# Patient Record
Sex: Male | Born: 1975 | Race: White | Marital: Married | Smoking: Never smoker
Health system: Southern US, Community
[De-identification: ages and names within clinical notes are randomized; demographics above are authoritative.]

## PROBLEM LIST (undated history)

## (undated) DIAGNOSIS — K219 Gastro-esophageal reflux disease without esophagitis: Secondary | ICD-10-CM

## (undated) DIAGNOSIS — R519 Headache, unspecified: Secondary | ICD-10-CM

## (undated) DIAGNOSIS — M1990 Unspecified osteoarthritis, unspecified site: Secondary | ICD-10-CM

## (undated) DIAGNOSIS — J45909 Unspecified asthma, uncomplicated: Secondary | ICD-10-CM

## (undated) HISTORY — DX: Gastro-esophageal reflux disease without esophagitis: K21.9

## (undated) HISTORY — PX: HERNIA REPAIR: SHX51

## (undated) HISTORY — PX: WISDOM TOOTH EXTRACTION: SHX21

## (undated) HISTORY — DX: Unspecified asthma, uncomplicated: J45.909

## (undated) HISTORY — PX: BACK SURGERY: SHX140

## (undated) HISTORY — DX: Headache, unspecified: R51.9

## (undated) HISTORY — DX: Unspecified osteoarthritis, unspecified site: M19.90

---

## 2015-07-07 ENCOUNTER — Other Ambulatory Visit: Payer: Self-pay | Admitting: Occupational Medicine

## 2015-07-07 ENCOUNTER — Ambulatory Visit
Admission: RE | Admit: 2015-07-07 | Discharge: 2015-07-07 | Disposition: A | Discharge status: Home / Self Care | Payer: No Typology Code available for payment source | Source: Ambulatory Visit | Attending: Occupational Medicine | Admitting: Occupational Medicine

## 2015-07-07 DIAGNOSIS — Z139 Encounter for screening, unspecified: Secondary | ICD-10-CM

## 2015-07-07 IMAGING — CR DG CHEST 1V
1 series · 1 of 1 positions shown · non-contrast
Comparison: None.

CLINICAL DATA: Pre-employment physical examination.

EXAM:
CHEST 1 VIEW

[w chest pa]
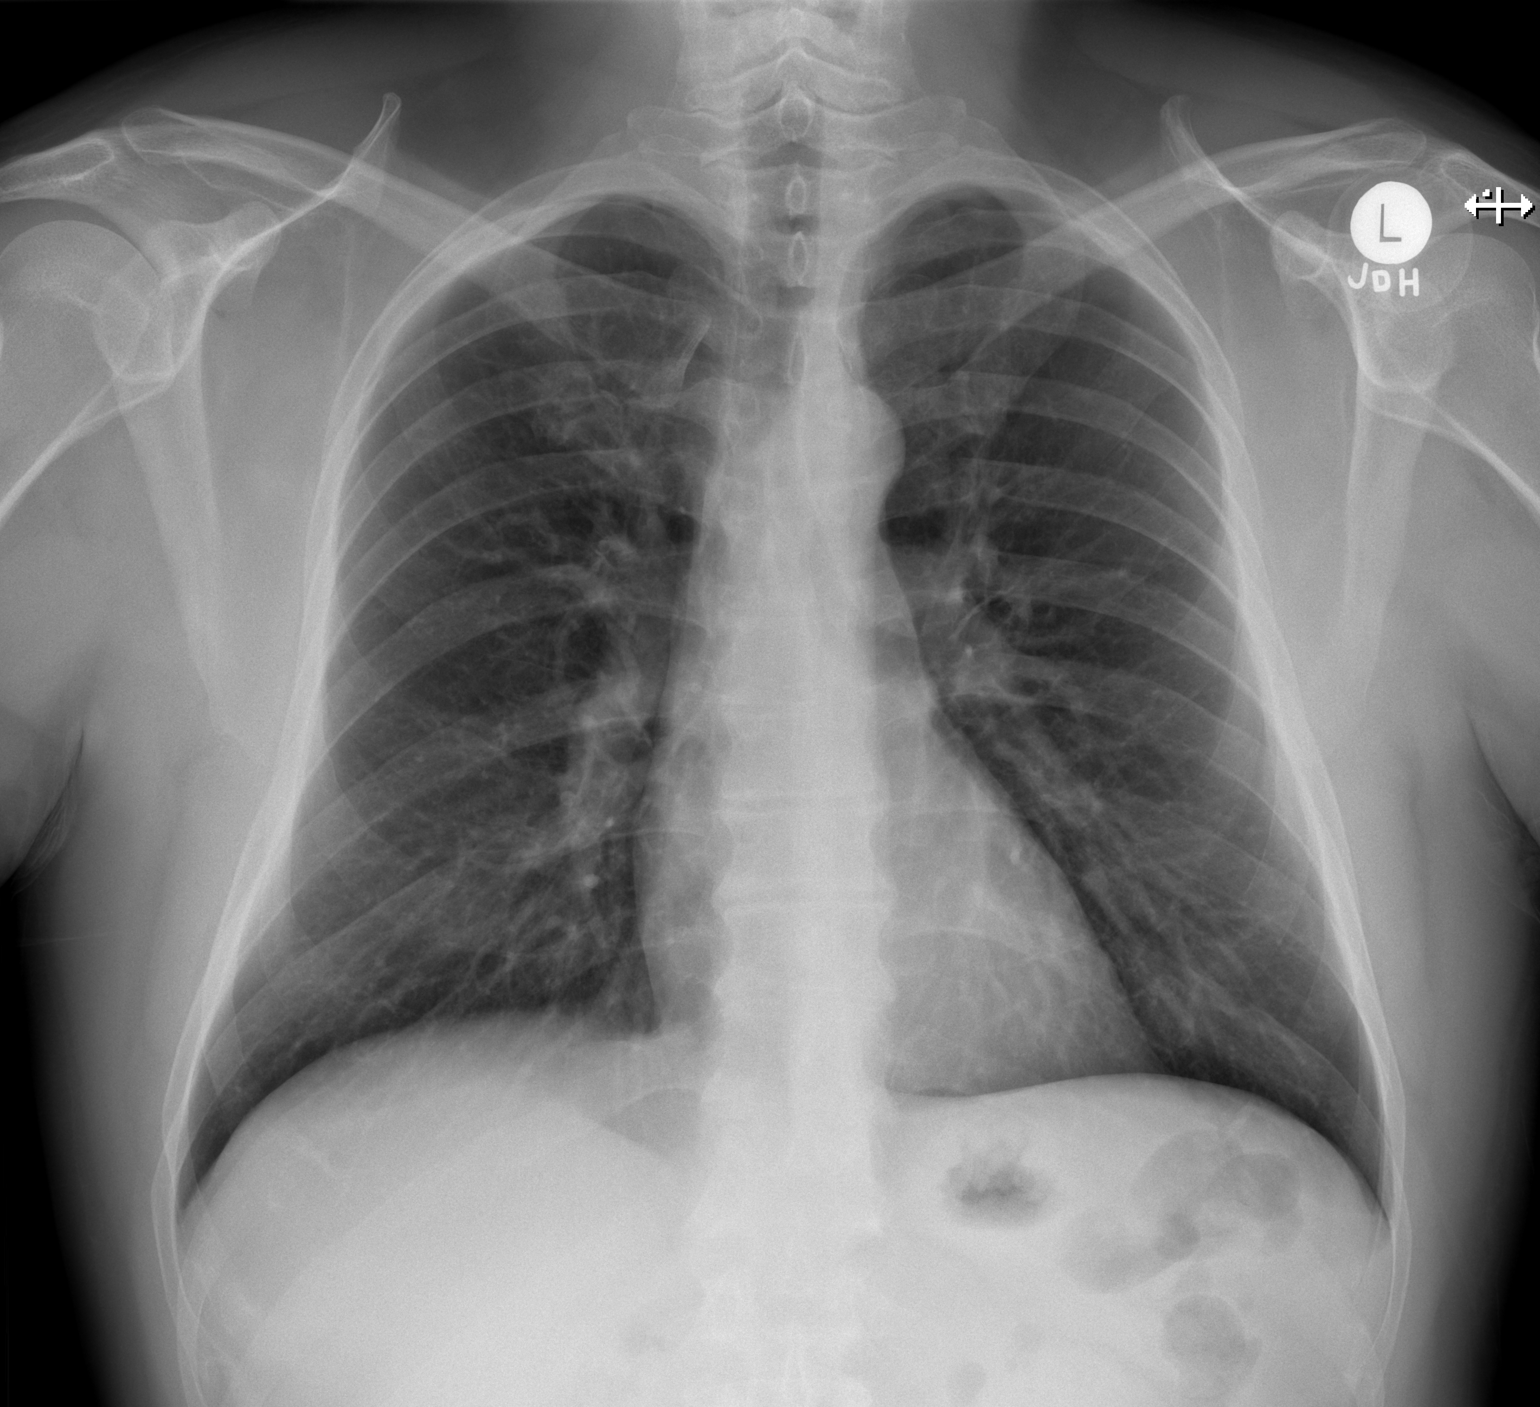

[1 of 1 positions shown; findings below may reference images not displayed]

FINDINGS: The heart size and mediastinal contours are within normal limits.
There is no evidence of pulmonary edema, consolidation,
pneumothorax, nodule or pleural fluid. The visualized skeletal
structures are unremarkable.
IMPRESSION: No active disease.

## 2019-09-14 ENCOUNTER — Other Ambulatory Visit: Payer: Self-pay | Admitting: Orthopedic Surgery

## 2019-09-14 DIAGNOSIS — Z77018 Contact with and (suspected) exposure to other hazardous metals: Secondary | ICD-10-CM

## 2019-09-14 DIAGNOSIS — M961 Postlaminectomy syndrome, not elsewhere classified: Secondary | ICD-10-CM

## 2019-10-14 ENCOUNTER — Ambulatory Visit
Admission: RE | Admit: 2019-10-14 | Discharge: 2019-10-14 | Disposition: A | Discharge status: Home / Self Care | Payer: 59 | Source: Ambulatory Visit | Attending: Orthopedic Surgery | Admitting: Orthopedic Surgery

## 2019-10-14 ENCOUNTER — Other Ambulatory Visit: Payer: Self-pay

## 2019-10-14 DIAGNOSIS — Z77018 Contact with and (suspected) exposure to other hazardous metals: Secondary | ICD-10-CM

## 2019-10-14 DIAGNOSIS — M961 Postlaminectomy syndrome, not elsewhere classified: Secondary | ICD-10-CM

## 2019-10-14 IMAGING — MR MR LUMBAR SPINE W/O CM
5 series · 43 of 48 positions shown · non-contrast
Comparison: None.

CLINICAL DATA: Post-laminectomy syndrome.  Lumbar region.

EXAM:
MRI LUMBAR SPINE WITHOUT CONTRAST
TECHNIQUE: Multiplanar, multisequence MR imaging of the lumbar spine was
performed. No intravenous contrast was administered.

[Series 2: T2 post-contrast · sagittal · 4.0mm · 0.88mm/px · 6 of 12 slices shown]
[im 1/12]
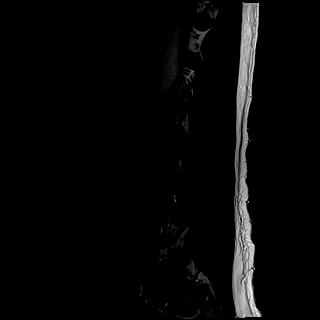
[im 3/12]
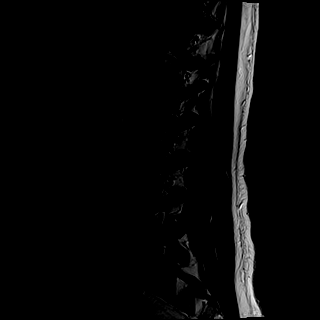
[im 5/12]
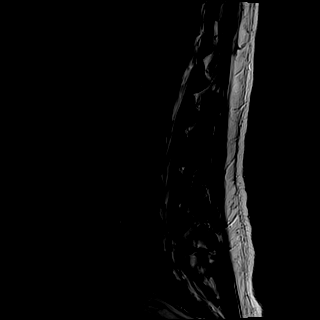
[im 7/12]
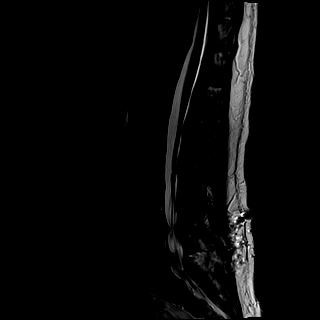
[im 9/12]
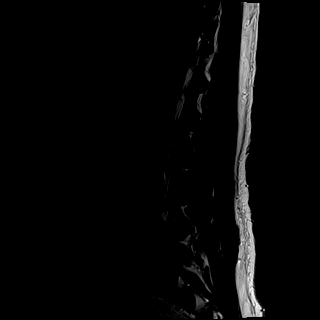
[im 12/12]
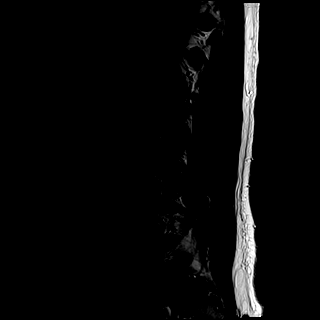

[Series 3: T1 · sagittal · 4.0mm · 0.88mm/px · 6 of 12 slices shown (1 of 2)]
[im 1/12]
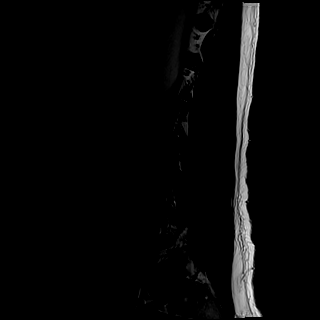
[im 3/12]
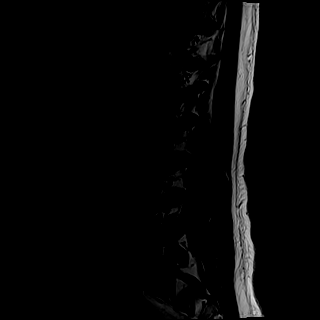
[im 5/12]
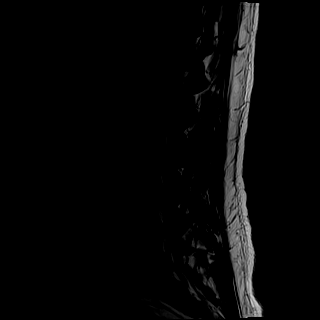
[im 7/12]
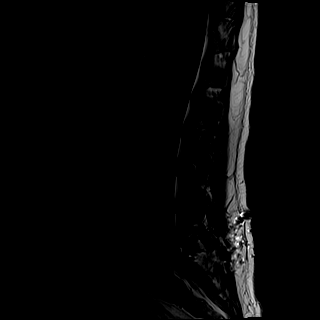
[im 9/12]
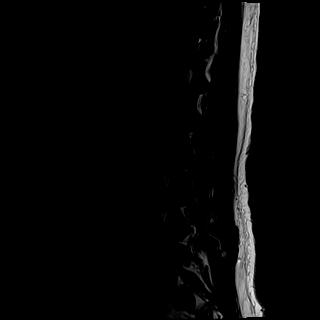
[im 12/12]
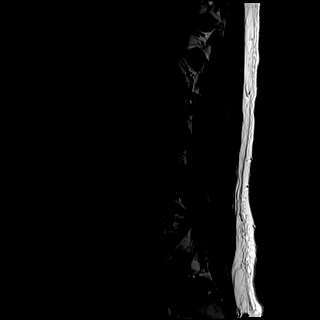

[Series 4: tirm sag · sagittal · 4.0mm · 0.55mm/px · 6 of 12 slices shown]
[im 1/12]
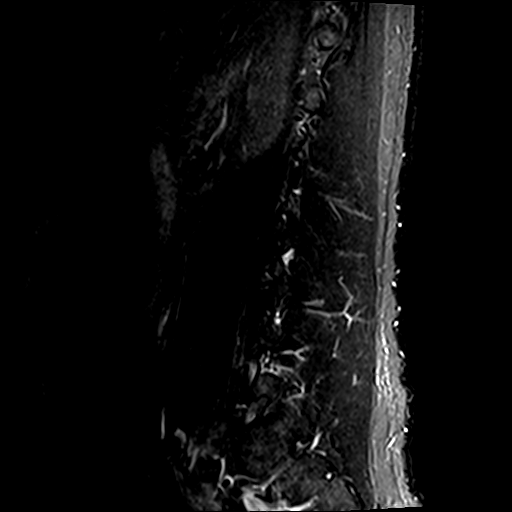
[im 3/12]
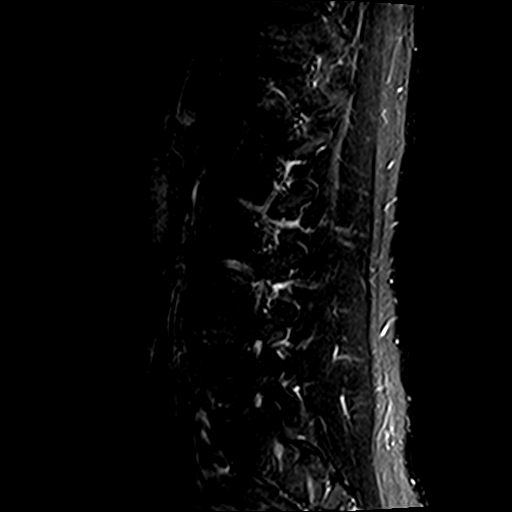
[im 5/12]
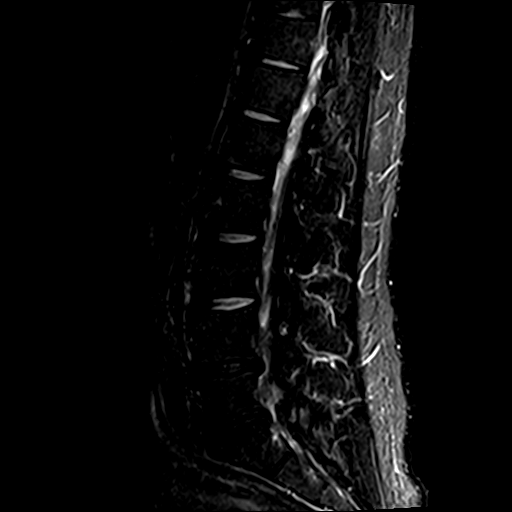
[im 7/12]
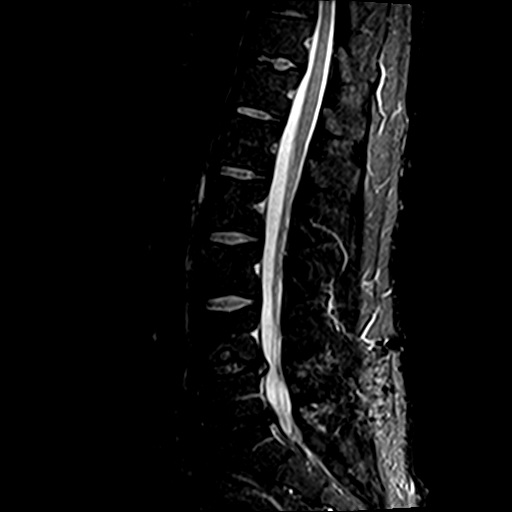
[im 9/12]
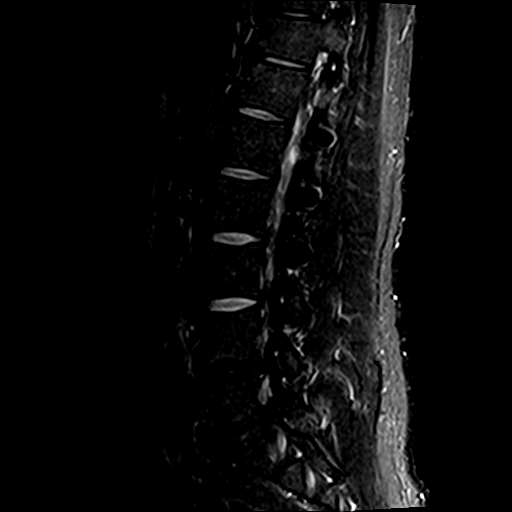
[im 12/12]
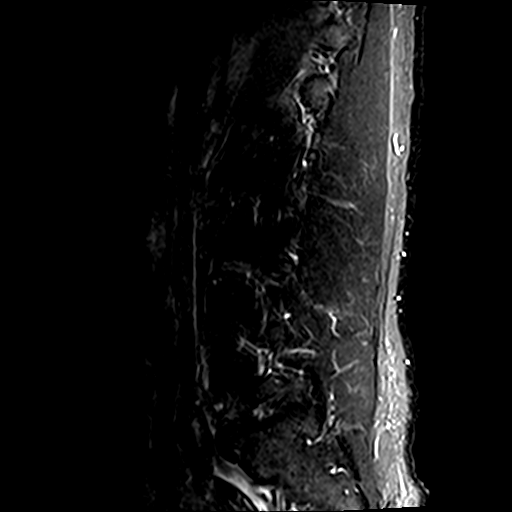

[Series 5: T1 · axial · 4.0mm · 0.78mm/px · z∈[-77,+102]mm · 10 of 32 slices shown (2 of 2)]
[im 1/32]
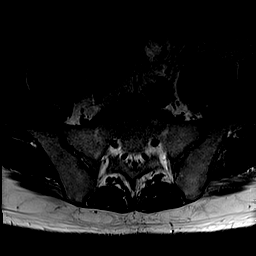
[im 3/32]
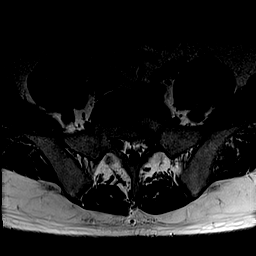
[im 5/32]
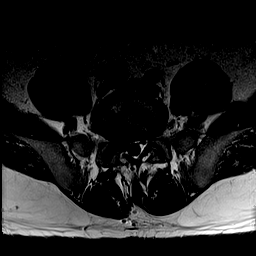
[im 9/32]
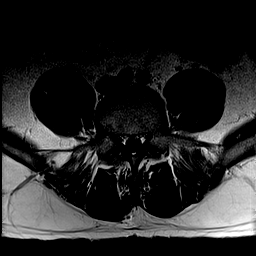
[im 14/32]
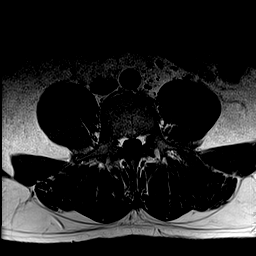
[im 16/32]
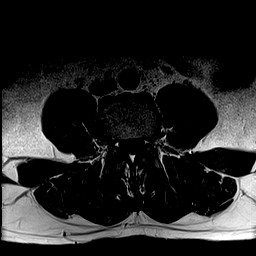
[im 18/32]
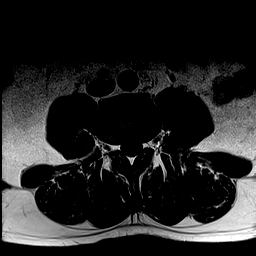
[im 23/32]
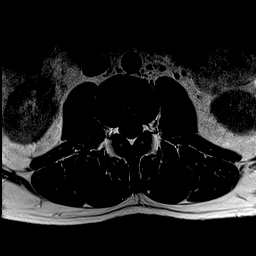
[im 27/32]
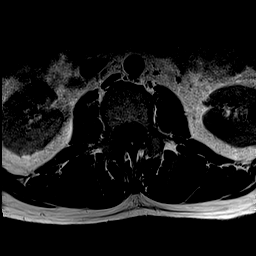
[im 32/32]
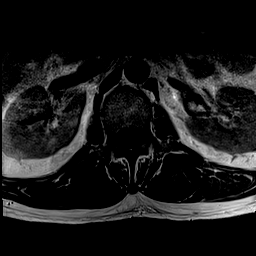

[Series 6: T2 · axial · 4.0mm · 0.78mm/px · z∈[-77,+102]mm · 15 of 32 slices shown]
[im 1/32]
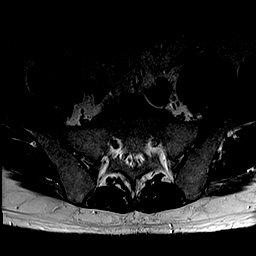
[im 3/32]
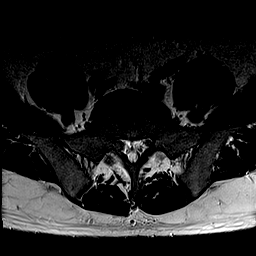
[im 5/32]
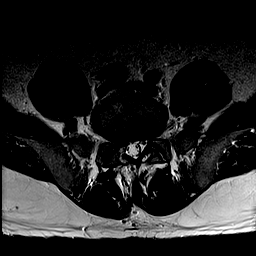
[im 7/32]
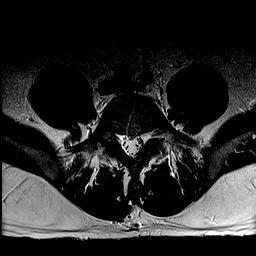
[im 9/32]
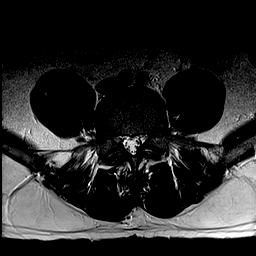
[im 12/32]
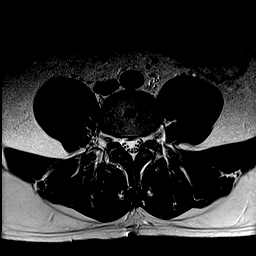
[im 14/32]
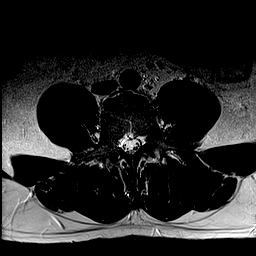
[im 16/32]
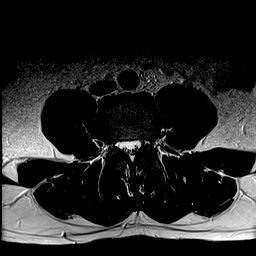
[im 18/32]
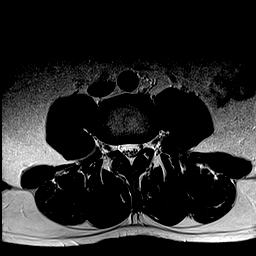
[im 20/32]
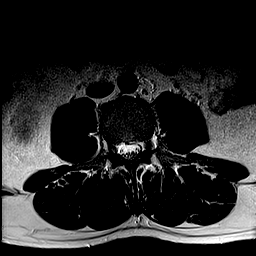
[im 23/32]
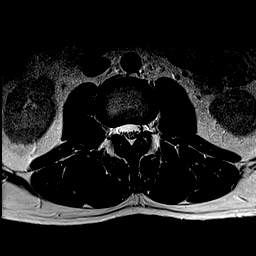
[im 25/32]
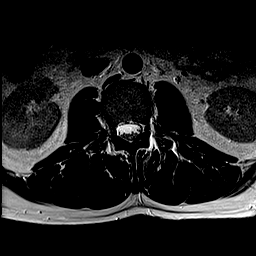
[im 27/32]
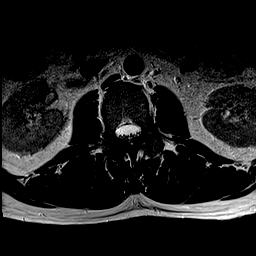
[im 29/32]
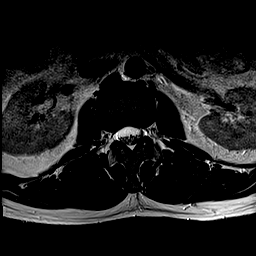
[im 32/32]
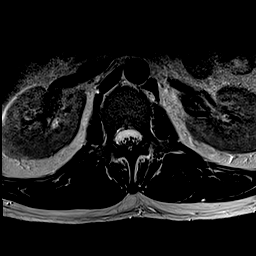

[43 of 48 positions shown; findings below may reference images not displayed]

FINDINGS: Segmentation:  Standard.

Alignment:  Physiologic.

Vertebrae: No fracture, evidence of discitis, or bone lesion.
Postsurgical changes from right laminotomy at L5-S1.

Conus medullaris and cauda equina: Conus extends to the L1 level.
Conus and cauda equina appear normal.

Paraspinal and other soft tissues: Negative.

Disc levels:

T12-L1: No spinal canal or neural foraminal stenosis.

L1-2: No spinal canal or neural foraminal stenosis.

L2-3: No spinal canal or neural foraminal stenosis.

L3-4: No spinal canal or neural foraminal stenosis.

L4-5: Loss of disc height, disc bulge, facet degenerative changes
and ligamentum flavum redundancy resulting in mild spinal canal
stenosis with narrowing of the bilateral subarticular zones and mild
bilateral neural foraminal narrowing.

L5-S1: Loss of disc height in the right asymmetric disc bulge
resulting in narrowing of the right subarticular zone, mild to
moderate right and mild left neural foraminal narrowing. T1
hypointense tissue around the traversing right S1 nerve root, may
represent postsurgical fibrosis.
IMPRESSION: 1. Postsurgical changes from right laminotomy at L5-S1. T1
hypointense tissue around the traversing right S1 nerve root, may
represent postsurgical fibrosis.
2. L4-5: Mild spinal canal stenosis with narrowing of the bilateral
subarticular zones and mild bilateral neural foraminal narrowing.
3. L5-S1: Loss of disc height in the right asymmetric disc bulge
resulting in narrowing of the right subarticular zone, mild to
moderate right and mild left neural foraminal narrowing.

## 2019-10-14 IMAGING — CR DG ORBITS FOR FOREIGN BODY
2 series · 2 of 2 positions shown · non-contrast
Comparison: None.

CLINICAL DATA: Metal working/exposure; clearance prior to MRI

EXAM:
ORBITS FOR FOREIGN BODY - 2 VIEW

[w orbit pa (1 of 2)]
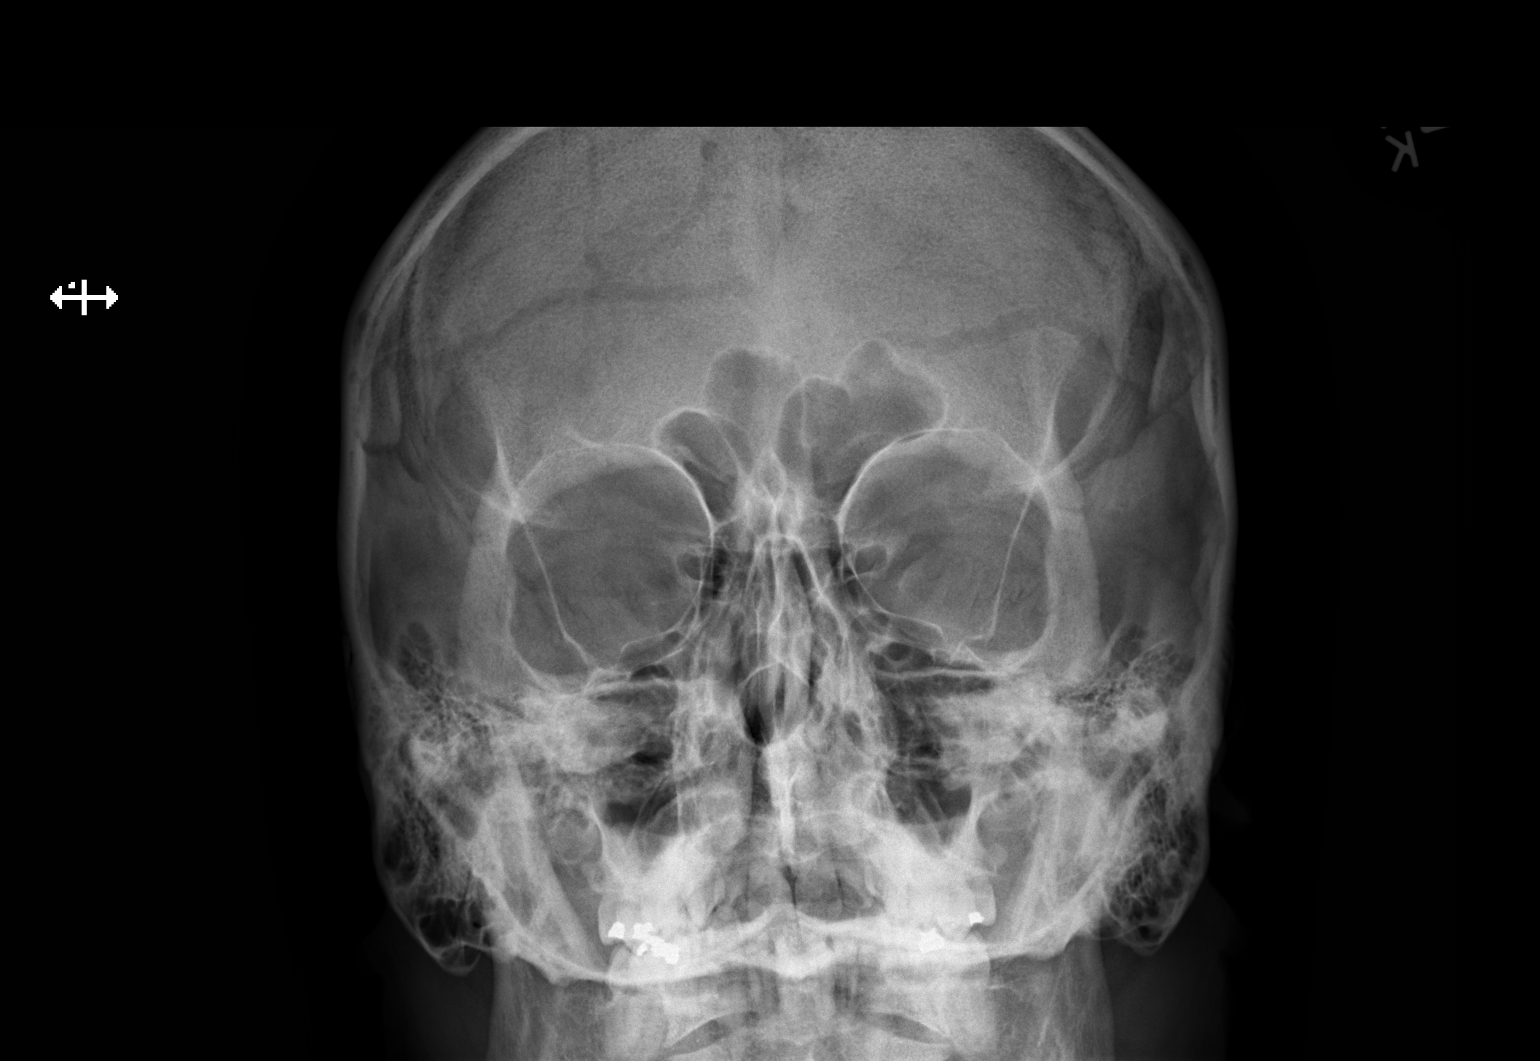

[w orbit pa (2 of 2)]
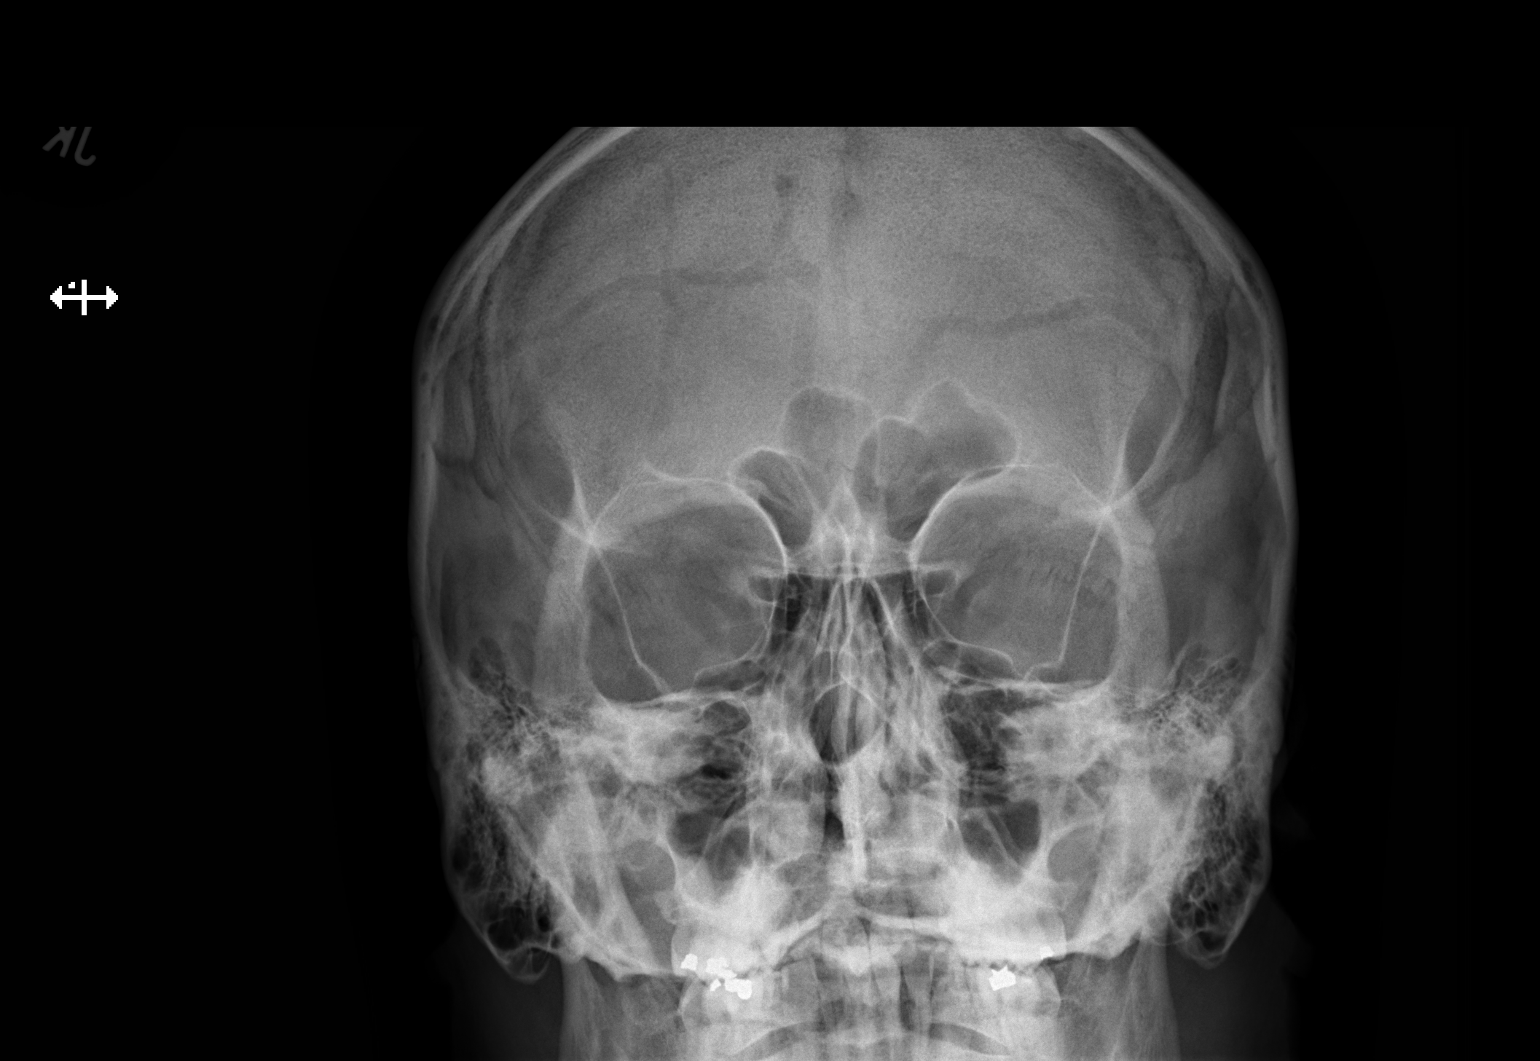

[2 of 2 positions shown; findings below may reference images not displayed]

FINDINGS: There is no evidence of metallic foreign body within the orbits. No
significant bone abnormality identified.
IMPRESSION: No evidence of metallic foreign body within the orbits.

## 2020-06-22 LAB — CBC AND DIFFERENTIAL
HCT: 46 (ref 41–53)
Hemoglobin: 16.2 (ref 13.5–17.5)
Platelets: 232 (ref 150–399)
WBC: 3.9

## 2020-06-22 LAB — BASIC METABOLIC PANEL
BUN: 13 (ref 4–21)
CO2: 30 — AB (ref 13–22)
Chloride: 103 (ref 99–108)
Creatinine: 1 (ref 0.6–1.3)
Glucose: 93
Potassium: 4.5 (ref 3.4–5.3)
Sodium: 138 (ref 137–147)

## 2020-06-22 LAB — HEPATIC FUNCTION PANEL
ALT: 34 (ref 10–40)
AST: 22 (ref 14–40)
Alkaline Phosphatase: 30 (ref 25–125)
Bilirubin, Total: 0.7

## 2020-06-22 LAB — LIPID PANEL
Cholesterol: 240 — AB (ref 0–200)
HDL: 31 — AB (ref 35–70)
LDL Cholesterol: 162
Triglycerides: 288 — AB (ref 40–160)

## 2020-06-22 LAB — COMPREHENSIVE METABOLIC PANEL
Albumin: 4.5 (ref 3.5–5.0)
Calcium: 9.3 (ref 8.7–10.7)
Globulin: 2.3

## 2020-08-08 ENCOUNTER — Ambulatory Visit: Payer: 59 | Admitting: Internal Medicine

## 2020-08-25 ENCOUNTER — Other Ambulatory Visit: Payer: Self-pay | Admitting: Rehabilitation

## 2020-08-25 DIAGNOSIS — M5412 Radiculopathy, cervical region: Secondary | ICD-10-CM

## 2020-09-03 ENCOUNTER — Other Ambulatory Visit: Payer: Self-pay

## 2020-09-03 ENCOUNTER — Ambulatory Visit
Admission: RE | Admit: 2020-09-03 | Discharge: 2020-09-03 | Disposition: A | Discharge status: Home / Self Care | Payer: 59 | Source: Ambulatory Visit | Attending: Rehabilitation | Admitting: Rehabilitation

## 2020-09-03 DIAGNOSIS — M5412 Radiculopathy, cervical region: Secondary | ICD-10-CM

## 2020-09-03 IMAGING — MR MR CERVICAL SPINE W/O CM
4 of 5 series · 28 of 48 positions shown · non-contrast
Comparison: None.

CLINICAL DATA: Initial evaluation for bilateral hand numbness and
weakness, pain from right onto shoulder.

EXAM:
MRI CERVICAL SPINE WITHOUT CONTRAST
TECHNIQUE: Multiplanar, multisequence MR imaging of the cervical spine was
performed. No intravenous contrast was administered.

[Series 3: T2 · sagittal · 3.0mm · 0.82mm/px · 7 of 18 slices shown (1 of 2)]
[im 1/18]
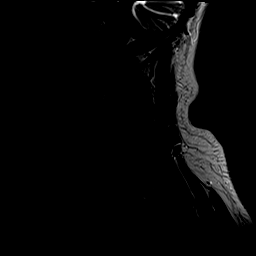
[im 3/18]
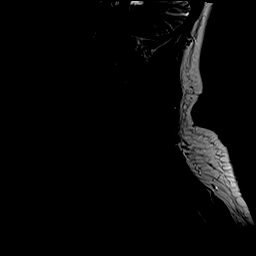
[im 6/18]
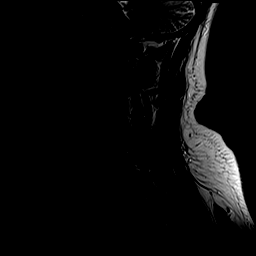
[im 9/18]
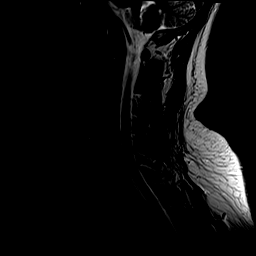
[im 12/18]
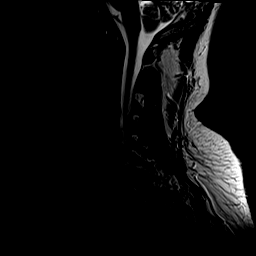
[im 15/18]
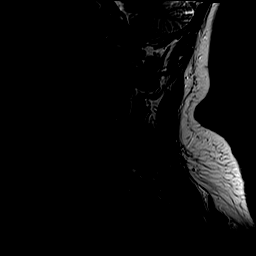
[im 18/18]
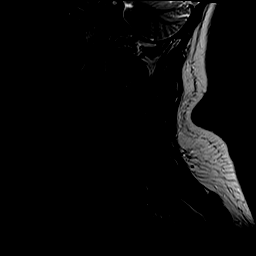

[Series 4: T1 · sagittal · 3.0mm · 0.41mm/px · 7 of 18 slices shown]
[im 1/18]
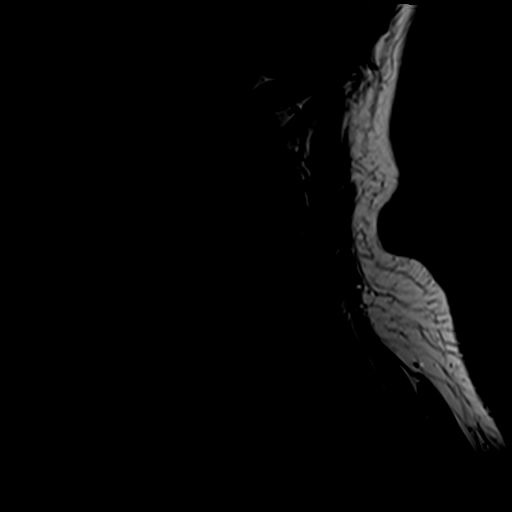
[im 3/18]
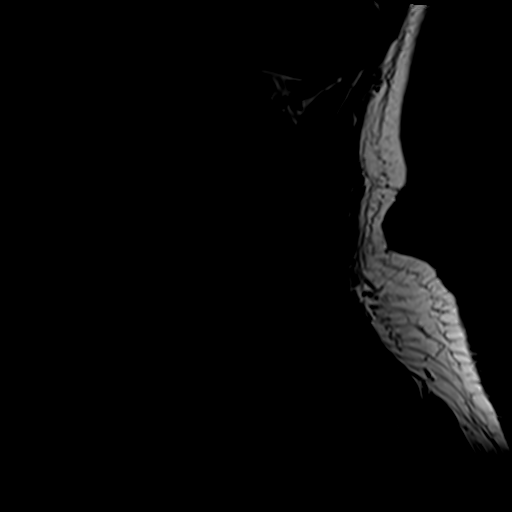
[im 6/18]
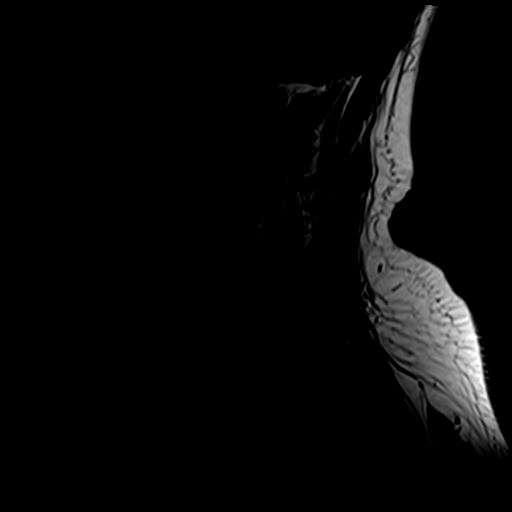
[im 9/18]
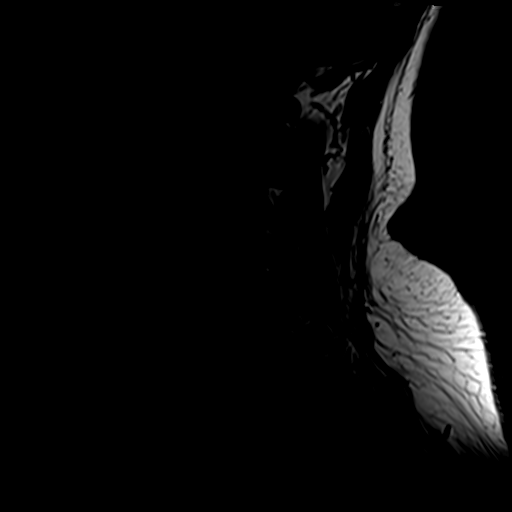
[im 12/18]
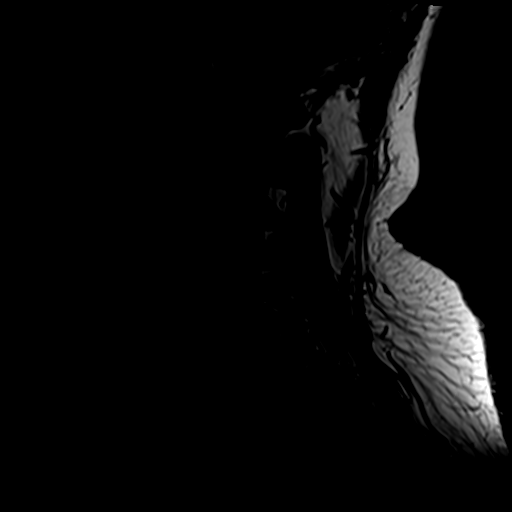
[im 15/18]
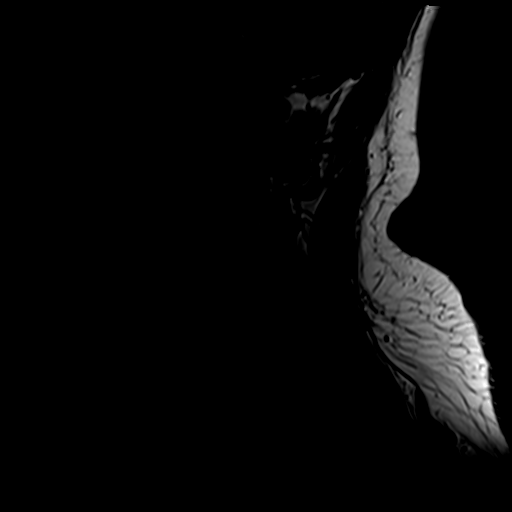
[im 18/18]
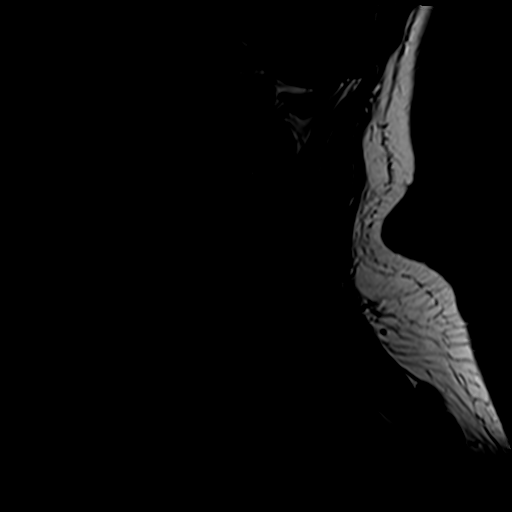

[Series 5: tir sag · sagittal · 3.0mm · 0.41mm/px · 6 of 18 slices shown]
[im 1/18]
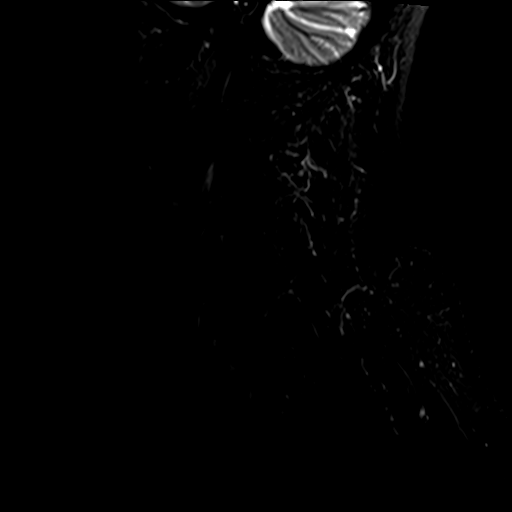
[im 3/18]
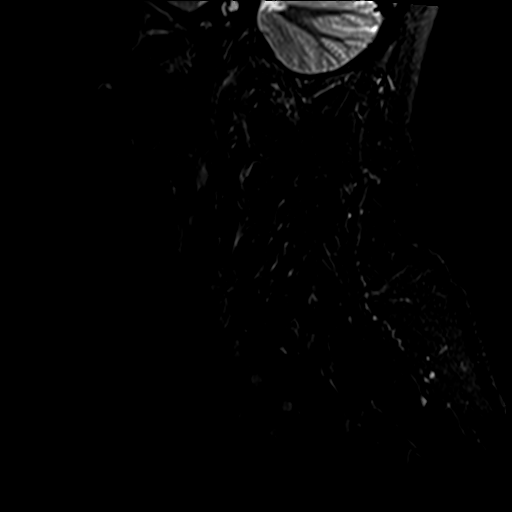
[im 6/18]
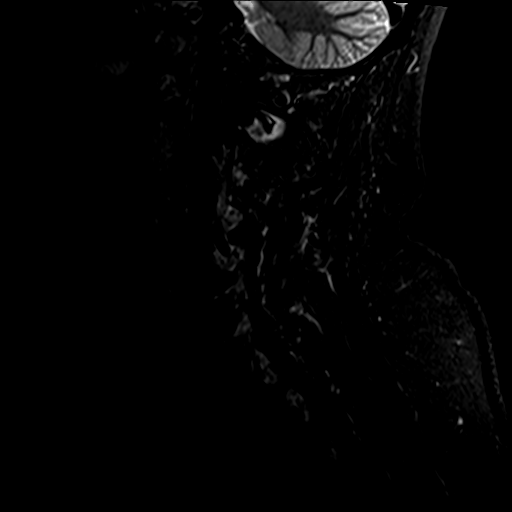
[im 9/18]
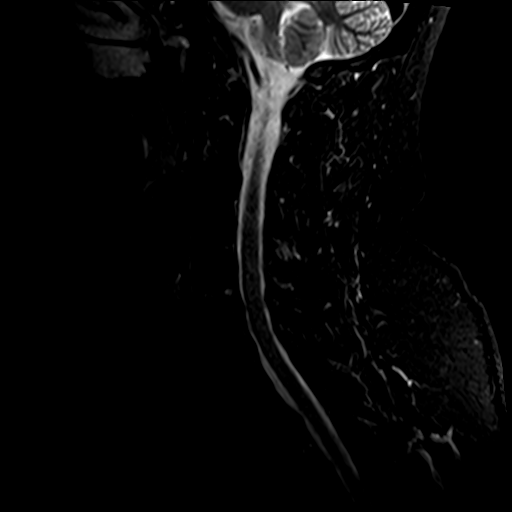
[im 12/18]
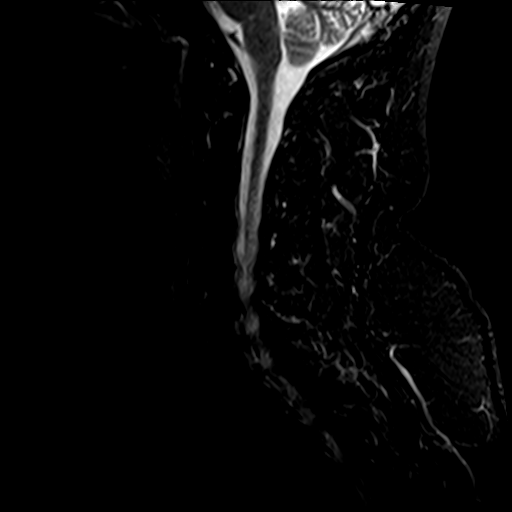
[im 15/18]
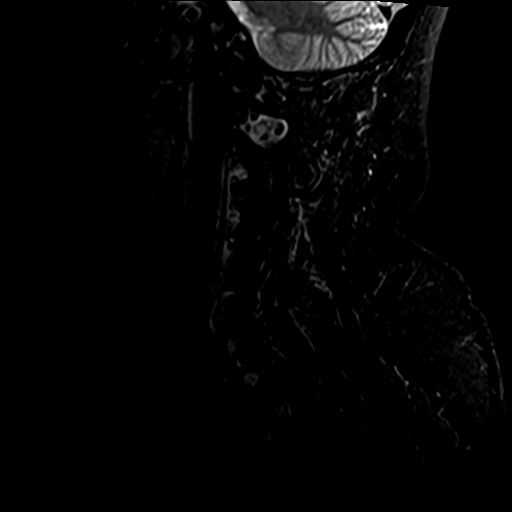

[Series 7: T2 · axial · 3.0mm · 0.70mm/px · z∈[-61,+63]mm · 8 of 34 slices shown (2 of 2)]
[im 1/34]
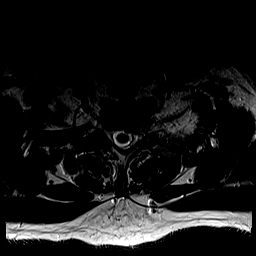
[im 6/34]
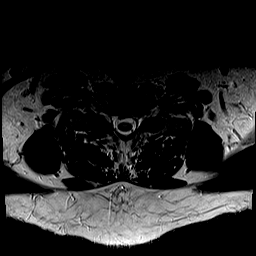
[im 11/34]
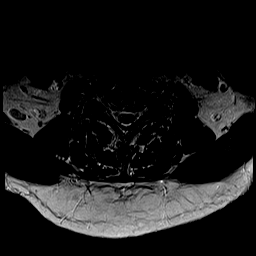
[im 16/34]
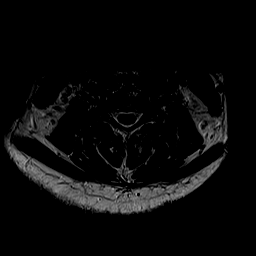
[im 18/34]
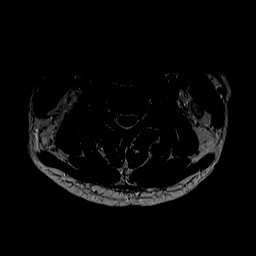
[im 23/34]
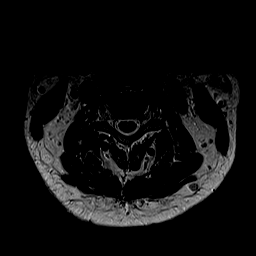
[im 28/34]
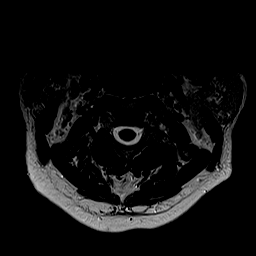
[im 34/34]
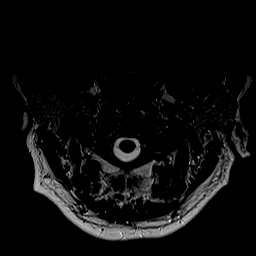

[28 of 48 positions shown; findings below may reference images not displayed]

FINDINGS: Alignment: Vertebral bodies normally aligned with preservation of
the normal cervical lordosis. No listhesis.

Vertebrae: Vertebral body height maintained without acute or chronic
fracture. Bone marrow signal intensity within normal limits. 1 cm
benign hemangioma noted within the C5 vertebral body. No other
discrete or worrisome osseous lesions. No abnormal marrow edema.

Cord: Normal signal and morphology.

Posterior Fossa, vertebral arteries, paraspinal tissues: Visualized
brain and posterior fossa within normal limits. Craniocervical
junction normal. Paraspinous and prevertebral soft tissues within
normal limits. Normal intravascular flow voids seen within the
vertebral arteries bilaterally.

Disc levels:

C2-C3: Unremarkable.

C3-C4:  Unremarkable.

C4-C5: Minimal disc bulge mildly indents the ventral thecal sac,
slightly asymmetric to the right. No spinal stenosis or significant
cord deformity. Foramina remain patent.

C5-C6: Diffuse disc bulge with bilateral uncovertebral hypertrophy.
Flattening and partial effacement of the ventral thecal sac with
resultant mild spinal stenosis, but no cord impingement. Mild right
C6 foraminal narrowing. No significant left foraminal encroachment.

C6-C7: Degenerative intervertebral disc space narrowing. Broad-based
left paracentral disc osteophyte complex indents the left ventral
thecal sac (series 7, image 26). Associated annular fissure. Mild
spinal stenosis with mild flattening of the left ventral cord, but
no cord signal changes. Moderate left worse than right C7 foraminal
narrowing.

C7-T1: Small central disc protrusion indents the ventral thecal sac,
eccentric to the right (series 6, image 27). No spinal stenosis or
cord deformity. Mild facet hypertrophy. No spinal stenosis.

T1-2: Small left paracentral disc protrusion indents the ventral
thecal sac (series 7, image 34), partially visualized. No
significant spinal stenosis. Foramina remain patent.
IMPRESSION: 1. Broad-based left paracentral disc osteophyte complex at C6-7 with
resultant mild spinal stenosis, with moderate left worse than right
C7 foraminal narrowing.
2. Disc bulge with uncovertebral hypertrophy at C5-6 with resultant
mild canal and right C6 foraminal stenosis.
3. Small central and left paracentral disc protrusions at C7-T1 and
T1-2 without significant stenosis.

## 2020-09-14 ENCOUNTER — Other Ambulatory Visit: Payer: Self-pay

## 2020-09-14 ENCOUNTER — Encounter: Payer: Self-pay | Admitting: Internal Medicine

## 2020-09-14 ENCOUNTER — Ambulatory Visit: Payer: 59 | Admitting: Internal Medicine

## 2020-09-14 VITALS — BP 114/76 | HR 86 | Temp 98.1°F | Ht 70.0 in | Wt 214.0 lb

## 2020-09-14 DIAGNOSIS — G8929 Other chronic pain: Secondary | ICD-10-CM | POA: Diagnosis not present

## 2020-09-14 DIAGNOSIS — M25531 Pain in right wrist: Secondary | ICD-10-CM

## 2020-09-14 DIAGNOSIS — Z23 Encounter for immunization: Secondary | ICD-10-CM | POA: Diagnosis not present

## 2020-09-14 DIAGNOSIS — Z0000 Encounter for general adult medical examination without abnormal findings: Secondary | ICD-10-CM | POA: Diagnosis not present

## 2020-09-14 DIAGNOSIS — E785 Hyperlipidemia, unspecified: Secondary | ICD-10-CM

## 2020-09-14 LAB — LIPID PANEL
Cholesterol: 250 mg/dL — ABNORMAL HIGH (ref 0–200)
HDL: 31.4 mg/dL — ABNORMAL LOW (ref >39.00)
LDL Cholesterol: 184 mg/dL — ABNORMAL HIGH (ref 0–99)
NonHDL: 218.14
Total CHOL/HDL Ratio: 8
Triglycerides: 170 mg/dL — ABNORMAL HIGH (ref 0.0–149.0)
VLDL: 34 mg/dL (ref 0.0–40.0)

## 2020-09-14 LAB — TSH: TSH: 1.46 u[IU]/mL (ref 0.35–4.50)

## 2020-09-14 NOTE — Progress Notes (Signed)
Subjective:  Patient ID: Neil Gilmore, male    DOB: [birth year]  Age: 45 y.o. MRN: [id number]  CC: Annual Exam  This visit occurred during the SARS-CoV-2 public health emergency.  Safety protocols were in place, including screening questions prior to the visit, additional usage of staff PPE, and extensive cleaning of exam room while observing appropriate contact time as indicated for disinfecting solutions.    HPI Neil Gilmore presents for a CPX and to establish.  He had labs done in occupational medicine a couple months ago.  His LDL was moderately elevated.  He has been seeing a back surgeon for chronic neck and back pain.  He tells me he is scheduled for an MRI.  He has developed weakness in his upper extremities worse on the right than the left.  He is also developed pain and decreased range of motion in his right wrist.  He is very active and denies any recent episodes of chest pain, shortness of breath, palpitations, edema, or fatigue.   History Neil Gilmore has a past medical history of Arthritis, Asthma, Frequent headaches, and GERD (gastroesophageal reflux disease).   He has a past surgical history that includes Hernia repair; Back surgery; and Wisdom tooth extraction.   His family history includes Aneurysm in his father; Arthritis in his mother; Heart attack in his maternal grandfather; Hypertension in his brother and mother.He reports that he has never smoked. He has never used smokeless tobacco. He reports current alcohol use of about 1.0 standard drink of alcohol per week. He reports that he does not use drugs.  No outpatient medications prior to visit.   No facility-administered medications prior to visit.    ROS Review of Systems  Constitutional: Negative.   HENT: Negative.   Eyes: Negative.   Respiratory: Negative for cough, chest tightness, shortness of breath and wheezing.   Cardiovascular: Negative for chest pain, palpitations and leg swelling.  Gastrointestinal:  Negative for abdominal pain, constipation, diarrhea, nausea and vomiting.  Endocrine: Negative.   Genitourinary: Negative.  Negative for difficulty urinating, penile swelling, scrotal swelling and testicular pain.  Musculoskeletal: Positive for arthralgias, back pain and neck pain. Negative for myalgias.  Skin: Negative for color change and pallor.  Neurological: Negative.  Negative for dizziness.  Hematological: Negative for adenopathy. Does not bruise/bleed easily.  Psychiatric/Behavioral: Negative.     Objective:  BP 114/76 (BP Location: Right Arm, Patient Position: Sitting, Cuff Size: Large)   Pulse 86   Temp 98.1 F (36.7 C) (Oral)   Ht 5\' 10"  (1.778 m)   Wt 214 lb (97.1 kg)   SpO2 96%   BMI 30.71 kg/m   Physical Exam Vitals reviewed.  Constitutional:      Appearance: Normal appearance.  HENT:     Mouth/Throat:     Mouth: Mucous membranes are moist.  Eyes:     General: No scleral icterus.    Conjunctiva/sclera: Conjunctivae normal.  Cardiovascular:     Rate and Rhythm: Normal rate and regular rhythm.     Pulses: Normal pulses.     Heart sounds: No murmur heard.   Pulmonary:     Effort: Pulmonary effort is normal.     Breath sounds: No stridor. No wheezing, rhonchi or rales.  Abdominal:     General: Abdomen is protuberant. Bowel sounds are normal. There is no distension.     Palpations: Abdomen is soft. There is no hepatomegaly, splenomegaly or mass.     Tenderness: There is no abdominal tenderness.  Musculoskeletal:  Right wrist: No swelling or bony tenderness. Decreased range of motion.     Left wrist: Normal.     Cervical back: Neck supple.     Right lower leg: No edema.     Left lower leg: No edema.  Lymphadenopathy:     Cervical: No cervical adenopathy.  Skin:    General: Skin is warm and dry.  Neurological:     General: No focal deficit present.     Mental Status: He is alert.  Psychiatric:        Mood and Affect: Mood normal.        Behavior:  Behavior normal.     Lab Results  Component Value Date   WBC 3.9 06/22/2020   HGB 16.2 06/22/2020   HCT 46 06/22/2020   PLT 232 06/22/2020   CHOL 250 (H) 09/14/2020   TRIG 170.0 (H) 09/14/2020   HDL 31.40 (L) 09/14/2020   LDLCALC 184 (H) 09/14/2020   ALT 34 06/22/2020   AST 22 06/22/2020   NA 138 06/22/2020   K 4.5 06/22/2020   CL 103 06/22/2020   CREATININE 1.0 06/22/2020   BUN 13 06/22/2020   CO2 30 (A) 06/22/2020   TSH 1.46 09/14/2020    Assessment & Plan:   Neil Gilmore was seen today for annual exam.  Diagnoses and all orders for this visit:  Routine general medical examination at a health care facility- Exam completed, labs reviewed, vaccines reviewed and updated, no cancer screenings are indicated, patient education was given. -     Lipid panel; Future -     Hepatitis C antibody; Future -     HIV Antibody (routine testing w rflx); Future -     HIV Antibody (routine testing w rflx) -     Hepatitis C antibody -     Lipid panel  Chronic wrist pain, right -     Ambulatory referral to Sports Medicine  Hyperlipidemia with target LDL less than 130- He has a modestly elevated LDL but does not have an elevated ASCVD risk score.  I therefore did not recommend a statin. -     TSH; Future -     TSH  Other orders -     Tdap vaccine greater than or equal to 7yo IM   [facility]" does not currently have medications on file.  No orders of the defined types were placed in this encounter.    Follow-up: Return in about 1 year (around 09/14/2021).  Sanda Linger, MD

## 2020-09-14 NOTE — Patient Instructions (Signed)

## 2020-09-15 LAB — HIV ANTIBODY (ROUTINE TESTING W REFLEX): HIV 1&2 Ab, 4th Generation: NONREACTIVE

## 2020-09-15 LAB — HEPATITIS C ANTIBODY
Hepatitis C Ab: NONREACTIVE
SIGNAL TO CUT-OFF: 0.01 (ref <1.00)

## 2020-09-16 ENCOUNTER — Encounter: Payer: Self-pay | Admitting: Internal Medicine

## 2020-09-20 NOTE — Progress Notes (Signed)
Subjective:    I'm seeing this patient as a consultation for: Dr. Scarlette Calico. Note will be routed back to referring provider/PCP.  CC: Right wrist pain  I, Wendy Poet, LAT, ATC, am serving as scribe for Dr. Lynne Leader.  HPI: Pt is a 45 y/o male c/o R wrist pain since January w/ no known MOI that is progressively worsening.  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  Pt locates pain to his B UEs, particularly the joints of the UEs (hands, wrists, elbows and shoulders).  He has also started having some pain in both of his knees.  Grip strength: decreased Numbness/tingling: yes, intermittently Aggravates: Nothing in particular Treatments tried: Tylenol; Advil; scheduled for a c-spine epidural  Dx imaging: 09/03/20 C-spine MRI  Past medical history, Surgical history, Family history, Social history, Allergies, and medications have been entered into the medical record, reviewed. Positive family history for lupus  Review of Systems: No new headache, visual changes, nausea, vomiting, diarrhea, constipation, dizziness, abdominal pain, skin rash, fevers, chills, night sweats, weight loss, swollen lymph nodes, body aches, joint swelling, muscle aches, chest pain, shortness of breath, mood changes, visual or auditory hallucinations.   Objective:    Vitals:   09/21/20 1300  BP: (!) 150/86  Pulse: 98  SpO2: 96%   General: Well Developed, well nourished, and in no acute distress.  Neuro/Psych: Alert and oriented x3, extra-ocular muscles intact, able to move all 4 extremities, sensation grossly intact. Skin: Warm and dry, no rashes noted.  Respiratory: Not using accessory muscles, speaking in full sentences, trachea midline.  Cardiovascular: Pulses palpable, no extremity edema. Abdomen: Does not appear distended. MSK: C-spine normal.  Normal cervical motion. Upper  extremity strength intact with exception of grip strength reduced 3+/5 bilaterally. Reflexes are intact bilaterally. Sensation is intact bilaterally.  Shoulder motion limited abduction. Elbow motion full. Wrist motion limited extension significantly bilaterally. Hand motion normal bilaterally.  Pulses capillary fill and sensation are intact throughout.  No rash visible.  Lab and Radiology Results Recent Results (from the past 2160 hour(s))  TSH     Status: None   Collection Time: 09/14/20 10:05 AM  Result Value Ref Range   TSH 1.46 0.35 - 4.50 uIU/mL  HIV Antibody (routine testing w rflx)     Status: None   Collection Time: 09/14/20 10:05 AM  Result Value Ref Range   HIV 1&2 Ab, 4th Generation NON-REACTIVE NON-REACTIVE    Comment: HIV-1 antigen and HIV-1/HIV-2 antibodies were not detected. There is no laboratory evidence of HIV infection. Marland Kitchen PLEASE NOTE: This information has been disclosed to you from records whose confidentiality may be protected by state law.  If your state requires such protection, then the state law prohibits you from making any further disclosure of the information without the specific written consent of the person to whom it pertains, or as otherwise permitted by law. A general authorization for the release of medical or other information is NOT sufficient for this purpose. . For additional information please refer to http://education.[facility].com/faq/FAQ106 (This link is being provided for informational/ educational purposes only.) . Marland Kitchen The performance of this assay has not been clinically validated in patients less than 64 years old. .   Hepatitis C antibody     Status: None   Collection Time: 09/14/20 10:05 AM  Result Value Ref Range  Hepatitis C Ab NON-REACTIVE NON-REACTIVE   SIGNAL TO CUT-OFF 0.01 <1.00    Comment: . HCV antibody was non-reactive. There is no laboratory  evidence of HCV infection. . In most cases, no further  action is required. However, if recent HCV exposure is suspected, a test for HCV RNA (test code [phone]) is suggested. . For additional information please refer to http://education.[facility].com/faq/FAQ22v1 (This link is being provided for informational/ educational purposes only.) .   Lipid panel     Status: Abnormal   Collection Time: 09/14/20 10:05 AM  Result Value Ref Range   Cholesterol 250 (H) 0 - 200 mg/dL    Comment: ATP III Classification       Desirable:  < 200 mg/dL               Borderline High:  200 - 239 mg/dL          High:  > = 240 mg/dL   Triglycerides 170.0 (H) 0.0 - 149.0 mg/dL    Comment: Normal:  <150 mg/dLBorderline High:  150 - 199 mg/dL   HDL 31.40 (L) >39.00 mg/dL   VLDL 34.0 0.0 - 40.0 mg/dL   LDL Cholesterol 184 (H) 0 - 99 mg/dL   Total CHOL/HDL Ratio 8     Comment:                Men          Women1/2 Average Risk     3.4          3.3Average Risk          5.0          4.42X Average Risk          9.6          7.13X Average Risk          15.0          11.0                       NonHDL 218.14     Comment: NOTE:  Non-HDL goal should be 30 mg/dL higher than patient's LDL goal (i.e. LDL goal of < 70 mg/dL, would have non-HDL goal of < 100 mg/dL)   MR CERVICAL SPINE WO CONTRAST  Result Date: 09/03/2020 CLINICAL DATA:  Initial evaluation for bilateral hand numbness and weakness, pain from right onto shoulder. EXAM: MRI CERVICAL SPINE WITHOUT CONTRAST TECHNIQUE: Multiplanar, multisequence MR imaging of the cervical spine was performed. No intravenous contrast was administered. COMPARISON:  None. FINDINGS: Alignment: Vertebral bodies normally aligned with preservation of the normal cervical lordosis. No listhesis. Vertebrae: Vertebral body height maintained without acute or chronic fracture. Bone marrow signal intensity within normal limits. 1 cm benign hemangioma noted within the C5 vertebral body. No other discrete or worrisome osseous lesions. No abnormal marrow  edema. Cord: Normal signal and morphology. Posterior Fossa, vertebral arteries, paraspinal tissues: Visualized brain and posterior fossa within normal limits. Craniocervical junction normal. Paraspinous and prevertebral soft tissues within normal limits. Normal intravascular flow voids seen within the vertebral arteries bilaterally. Disc levels: C2-C3: Unremarkable. C3-C4:  Unremarkable. C4-C5: Minimal disc bulge mildly indents the ventral thecal sac, slightly asymmetric to the right. No spinal stenosis or significant cord deformity. Foramina remain patent. C5-C6: Diffuse disc bulge with bilateral uncovertebral hypertrophy. Flattening and partial effacement of the ventral thecal sac with resultant mild spinal stenosis, but no cord impingement. Mild right C6 foraminal narrowing. No significant left foraminal encroachment. C6-C7:  Degenerative intervertebral disc space narrowing. Broad-based left paracentral disc osteophyte complex indents the left ventral thecal sac (series 7, image 26). Associated annular fissure. Mild spinal stenosis with mild flattening of the left ventral cord, but no cord signal changes. Moderate left worse than right C7 foraminal narrowing. C7-T1: Small central disc protrusion indents the ventral thecal sac, eccentric to the right (series 6, image 27). No spinal stenosis or cord deformity. Mild facet hypertrophy. No spinal stenosis. T1-2: Small left paracentral disc protrusion indents the ventral thecal sac (series 7, image 34), partially visualized. No significant spinal stenosis. Foramina remain patent. IMPRESSION: 1. Broad-based left paracentral disc osteophyte complex at C6-7 with resultant mild spinal stenosis, with moderate left worse than right C7 foraminal narrowing. 2. Disc bulge with uncovertebral hypertrophy at C5-6 with resultant mild canal and right C6 foraminal stenosis. 3. Small central and left paracentral disc protrusions at C7-T1 and T1-2 without significant stenosis.  Electronically Signed   By: Jeannine Boga M.D.   On: 09/03/2020 18:52   I, Lynne Leader, personally (independently) visualized and performed the interpretation of the images attached in this note.   X-ray images hands bilaterally obtained today personally and independently interpreted  Right hand: No severe DJD.  No erosions visible.  No acute fractures  Left hand: No severe DJD.  No erosions visible.  No acute fractures  Await formal radiology review  Impression and Recommendations:    Assessment and Plan: 45 y.o. male with bilateral hand and wrist pain and stiffness associated with bilateral shoulder soreness hand paresthesias and diffuse myalgias and arthralgias.  Etiology is unclear and very likely to be multifactorial.  Cervical radiculopathy.  Patient likely does have right-sided cervical radiculopathy at C6 and C7 based on recent MRI.  His neurosurgery team is proceeding with epidural steroid injection which I think is a good idea.  This does not explain all of his symptoms however.  He has persistent problematic wrist lack of motion and diffuse arthralgias and myalgias.  This is concerning for a rheumatologic problem.  He has a positive family history for lupus.  Plan for extensive rheumatologic work-up listed below.  Additionally refer to physical therapy for hand PT.  Additionally he has hand paresthesia and lack of grip strength.  Grip strength is not characteristic of C6 or C7 radiculopathy.  I am concerned he may have carpal tunnel syndrome as well.  Plan for nerve conduction study to further characterize this.  Symptoms are severe progressive and interfering with quality of life and ability to work.  Recheck in 1 month.  PDMP not reviewed this encounter. Orders Placed This Encounter  Procedures  . DG Hand Complete Right    Standing Status:   Future    Number of Occurrences:   1    Standing Expiration Date:   09/21/2021    Order Specific Question:   Reason for  Exam (SYMPTOM  OR DIAGNOSIS REQUIRED)    Answer:   eval hand pain    Order Specific Question:   Preferred imaging location?    Answer:   [facility]  . DG Hand Complete Left    Standing Status:   Future    Number of Occurrences:   1    Standing Expiration Date:   09/21/2021    Order Specific Question:   Reason for Exam (SYMPTOM  OR DIAGNOSIS REQUIRED)    Answer:   eval hand pain    Order Specific Question:   Preferred imaging location?    Answer:  [facility]  . LUPUS(12) PANEL    Standing Status:   Future    Number of Occurrences:   1    Standing Expiration Date:   09/21/2021  . CK    Standing Status:   Future    Number of Occurrences:   1    Standing Expiration Date:   09/21/2021  . Sedimentation rate    Standing Status:   Future    Number of Occurrences:   1    Standing Expiration Date:   09/21/2021  . B. burgdorfi antibodies    Standing Status:   Future    Number of Occurrences:   1    Standing Expiration Date:   09/21/2021  . Uric acid    Standing Status:   Future    Number of Occurrences:   1    Standing Expiration Date:   09/21/2021  . HLA-B27 antigen    Standing Status:   Future    Number of Occurrences:   1    Standing Expiration Date:   09/21/2021  . Rheumatoid factor    Standing Status:   Future    Number of Occurrences:   1    Standing Expiration Date:   09/21/2021  . Cyclic citrul peptide antibody, IgG    Standing Status:   Future    Number of Occurrences:   1    Standing Expiration Date:   09/21/2021  . Ambulatory referral to Neurology    Referral Priority:   Routine    Referral Type:   Consultation    Referral Reason:   Specialty Services Required    Requested Specialty:   Neurology    Number of Visits Requested:   1  . Ambulatory referral to Physical Therapy    Referral Priority:   Routine    Referral Type:   Physical Medicine    Referral Reason:   Specialty Services Required    Requested Specialty:   Physical Therapy    Number of  Visits Requested:   1  . NCV with EMG(electromyography)    Standing Status:   Future    Standing Expiration Date:   09/21/2021    Order Specific Question:   Where should this test be performed?    Answer:   [facility]   No orders of the defined types were placed in this encounter.   Discussed warning signs or symptoms. Please see discharge instructions. Patient expresses understanding.   The above documentation has been reviewed and is accurate and complete Lynne Leader, M.D.

## 2020-09-21 ENCOUNTER — Ambulatory Visit: Payer: 59 | Admitting: Family Medicine

## 2020-09-21 ENCOUNTER — Ambulatory Visit (INDEPENDENT_AMBULATORY_CARE_PROVIDER_SITE_OTHER): Payer: 59

## 2020-09-21 ENCOUNTER — Encounter: Payer: Self-pay | Admitting: Family Medicine

## 2020-09-21 ENCOUNTER — Other Ambulatory Visit: Payer: Self-pay

## 2020-09-21 VITALS — BP 150/86 | HR 98 | Ht 70.0 in | Wt 216.2 lb

## 2020-09-21 DIAGNOSIS — M79641 Pain in right hand: Secondary | ICD-10-CM

## 2020-09-21 DIAGNOSIS — M791 Myalgia, unspecified site: Secondary | ICD-10-CM

## 2020-09-21 DIAGNOSIS — M2550 Pain in unspecified joint: Secondary | ICD-10-CM

## 2020-09-21 DIAGNOSIS — R202 Paresthesia of skin: Secondary | ICD-10-CM

## 2020-09-21 DIAGNOSIS — M79642 Pain in left hand: Secondary | ICD-10-CM

## 2020-09-21 DIAGNOSIS — M0580 Other rheumatoid arthritis with rheumatoid factor of unspecified site: Secondary | ICD-10-CM

## 2020-09-21 DIAGNOSIS — R29898 Other symptoms and signs involving the musculoskeletal system: Secondary | ICD-10-CM

## 2020-09-21 LAB — CK: Total CK: 91 U/L (ref 7–232)

## 2020-09-21 LAB — URIC ACID: Uric Acid, Serum: 6.4 mg/dL (ref 4.0–7.8)

## 2020-09-21 LAB — SEDIMENTATION RATE: Sed Rate: 32 mm/hr — ABNORMAL HIGH (ref 0–15)

## 2020-09-21 IMAGING — DX DG HAND COMPLETE 3+V*R*
3 series · 3 of 3 positions shown · non-contrast
Comparison: None.

CLINICAL DATA: RIGHT wrist pain since [REDACTED].  Worsening pain.

EXAM:
RIGHT HAND - COMPLETE 3+ VIEW

[hand ap]
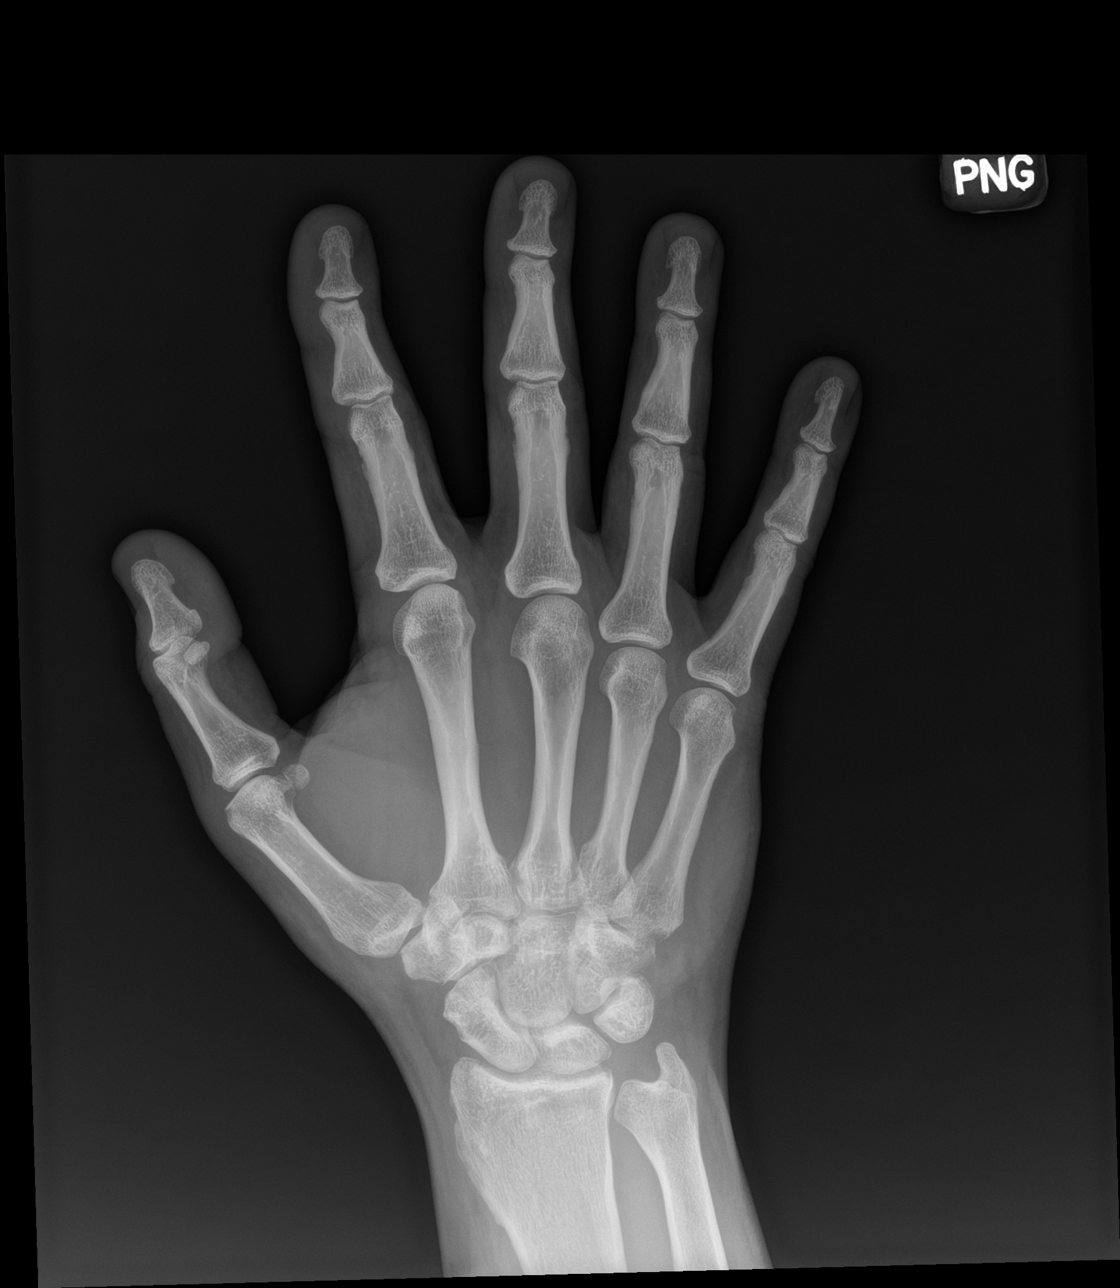

[hand obl]
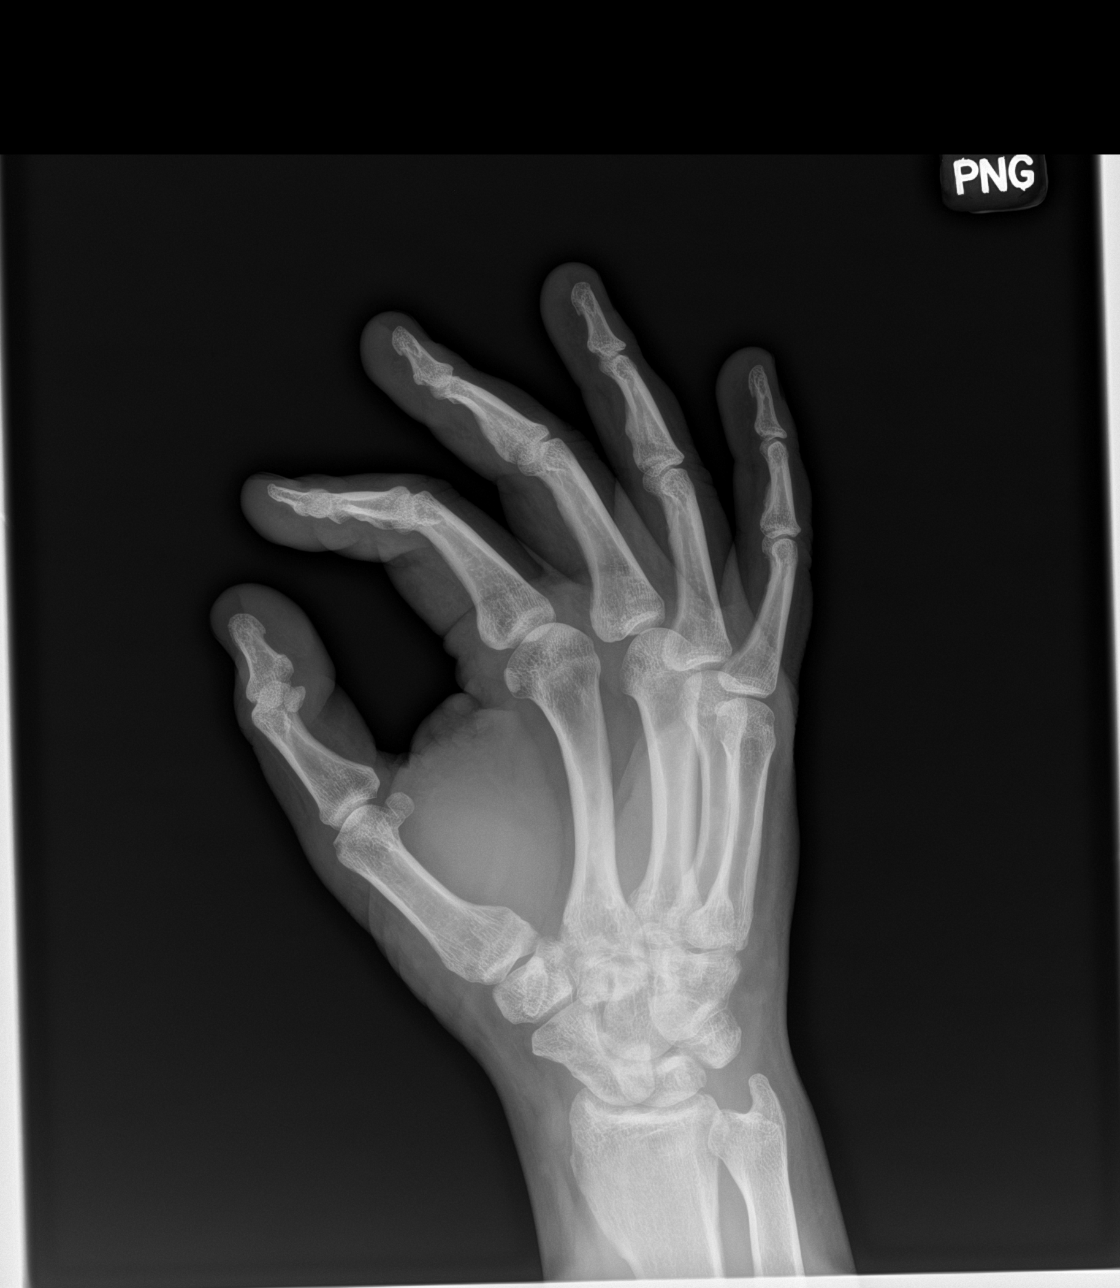

[hand lat]
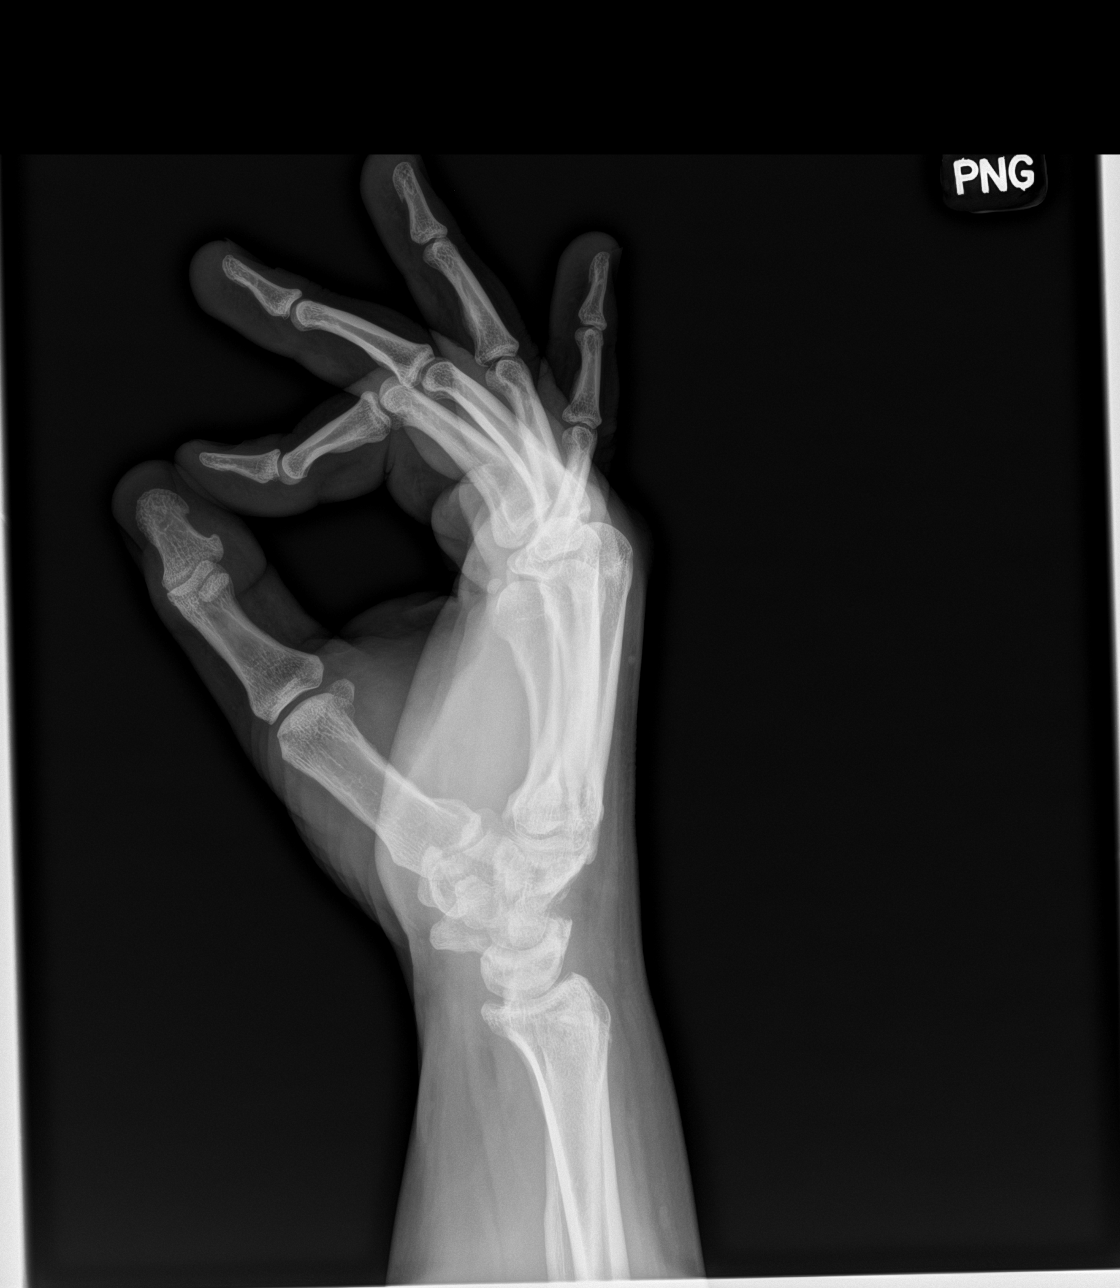

[3 of 3 positions shown; findings below may reference images not displayed]

FINDINGS: Osseous alignment is normal. Bone mineralization is normal. No
fracture line or displaced fracture fragment. No acute-appearing
cortical irregularity or osseous lesion. No appreciable degenerative
change.
IMPRESSION: Negative.

## 2020-09-21 IMAGING — DX DG HAND COMPLETE 3+V*L*
3 series · 3 of 3 positions shown · non-contrast
Comparison: None.

CLINICAL DATA: Pain.

EXAM:
LEFT HAND - COMPLETE 3+ VIEW

[hand ap]
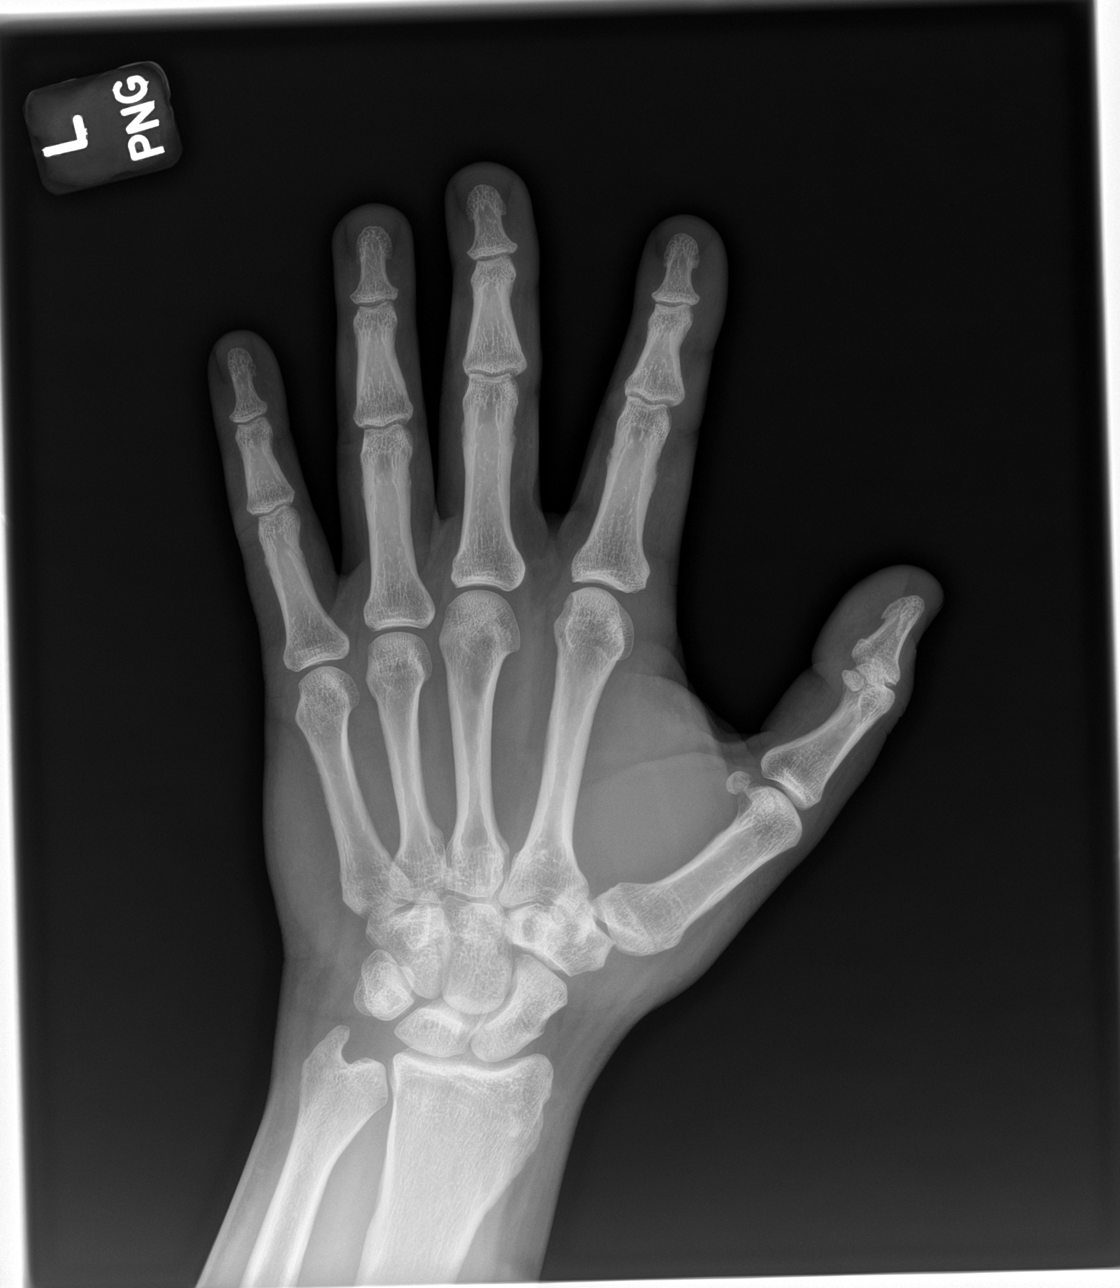

[hand obl]
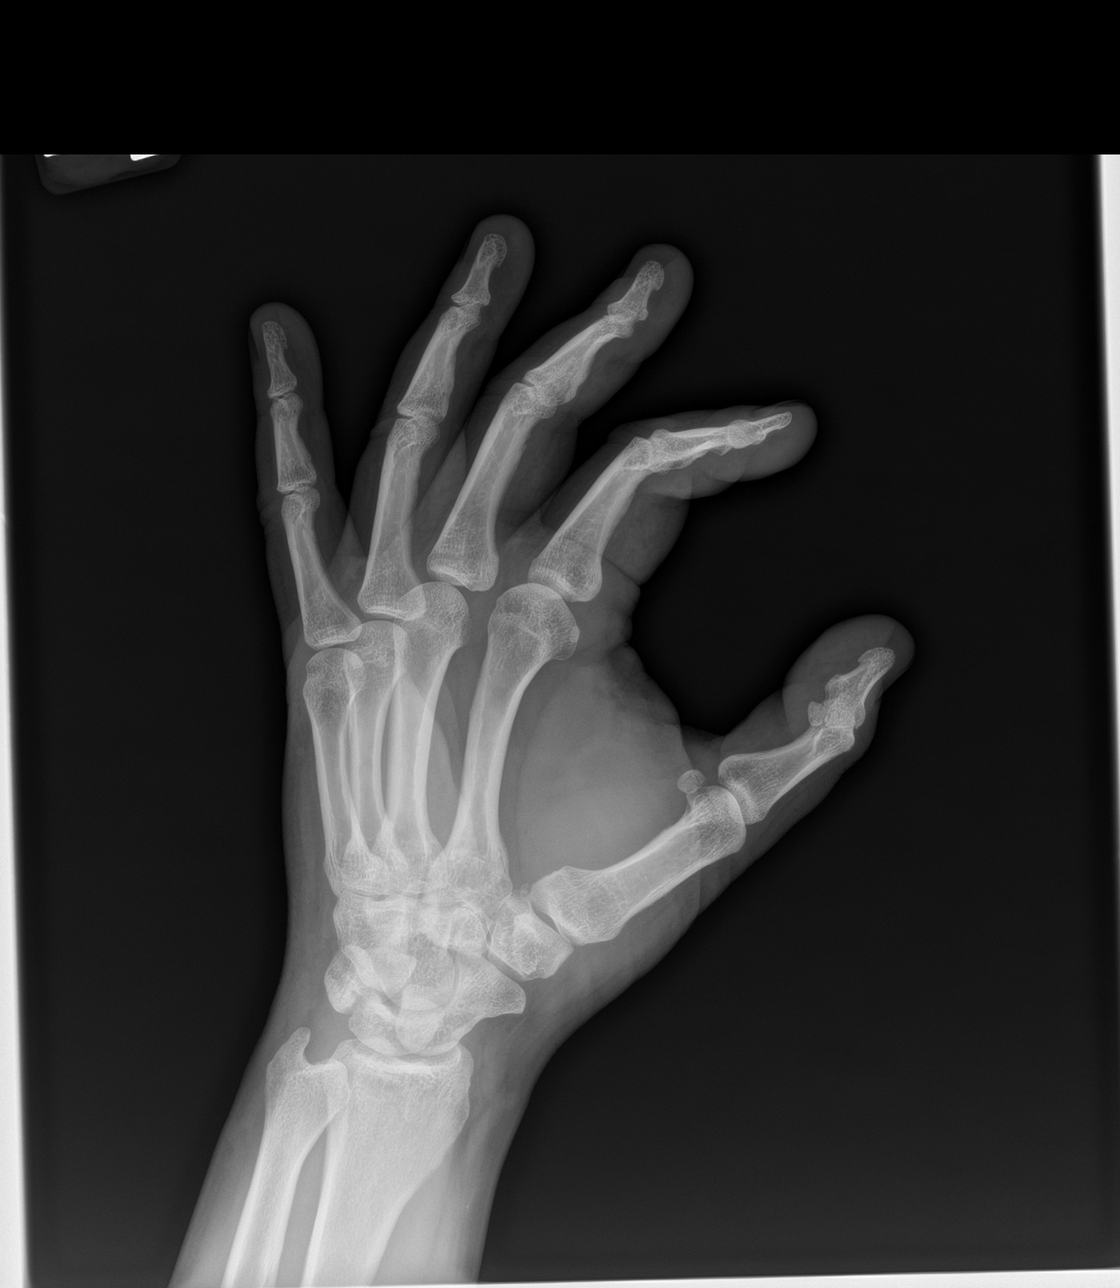

[hand lat]
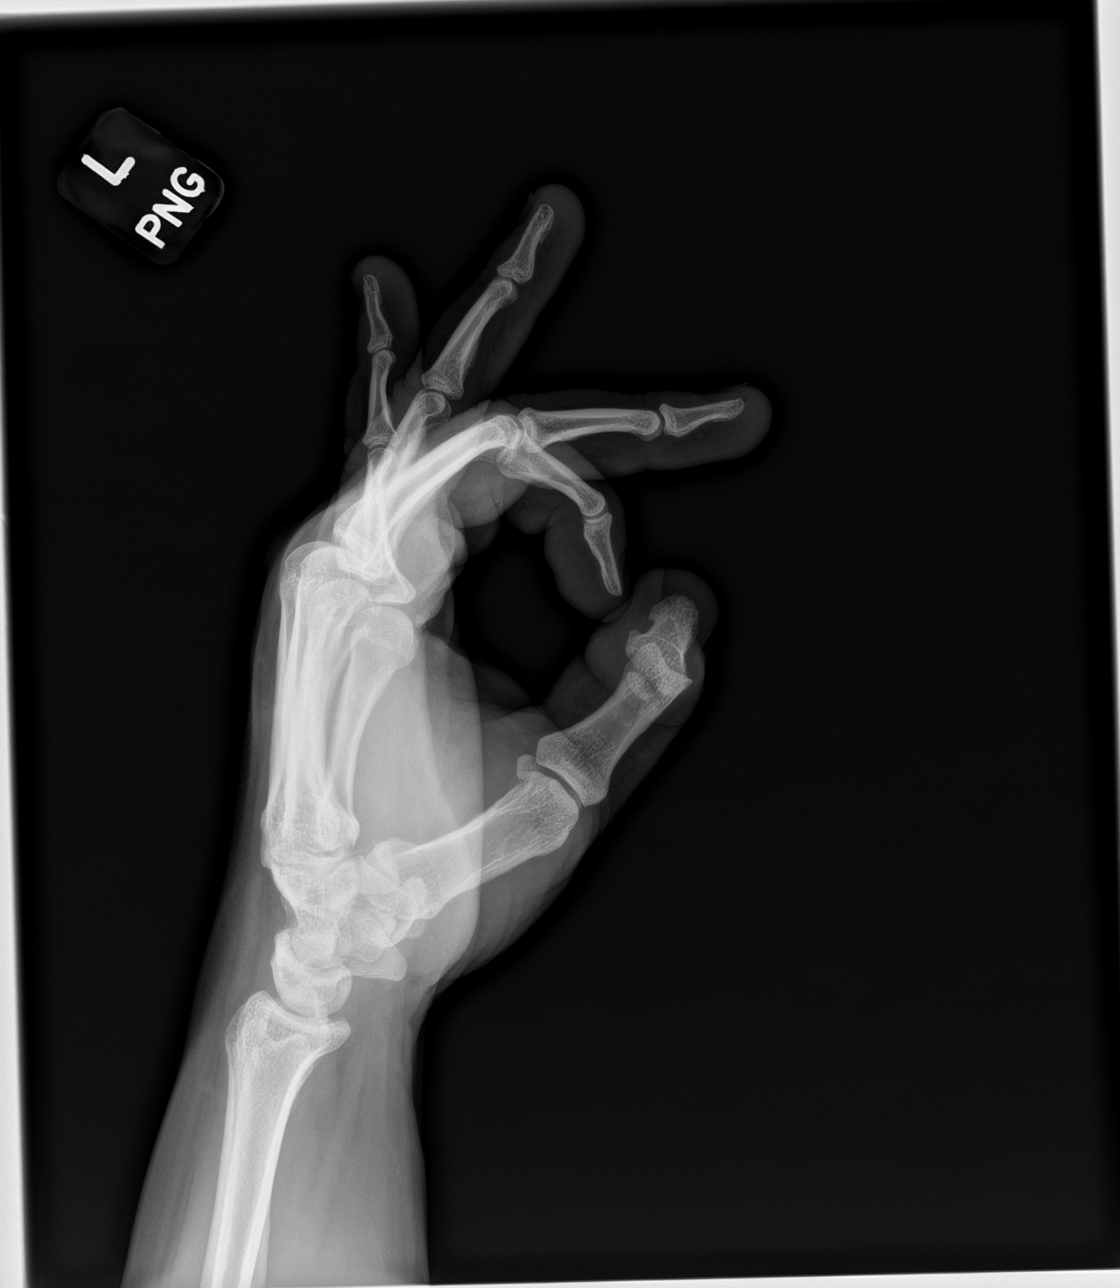

[3 of 3 positions shown; findings below may reference images not displayed]

FINDINGS: Osseous alignment is normal. Bone mineralization is normal. No
fracture line or displaced fracture fragment. No acute-appearing
cortical irregularity or osseous lesion. No appreciable degenerative
change. Perhaps minimal spurring at the first CMC joint.
IMPRESSION: 1. No acute findings.
2. Perhaps minimal degenerative spurring at the first CMC joint. No
other appreciable degenerative change.

## 2020-09-21 NOTE — Patient Instructions (Addendum)
Thank you for coming in today.  Please get an Xray today before you leave Please get labs today before you leave  I've referred you to Physical Therapy.  Let us know if you don't hear from them in one week.  Plan for nerve conduction study.   Recheck in 1 month.   Let me know if you have a problem.

## 2020-09-22 NOTE — Progress Notes (Signed)
Sedimentation rate a marker of general inflammation is minimally elevated.  This is probably not high enough to signify any significant disease.  Other labs are still pending.

## 2020-09-25 LAB — LUPUS(12) PANEL
Anti Nuclear Antibody (ANA): POSITIVE — AB
C3 Complement: 167 mg/dL (ref 82–185)
C4 Complement: 20 mg/dL (ref 15–53)
ENA SM Ab Ser-aCnc: <1 AI
Rheumatoid fact SerPl-aCnc: 66 IU/mL — ABNORMAL HIGH (ref <14)
Ribosomal P Protein Ab: <1 AI
SM/RNP: <1 AI
SSA (Ro) (ENA) Antibody, IgG: <1 AI
SSB (La) (ENA) Antibody, IgG: <1 AI
Scleroderma (Scl-70) (ENA) Antibody, IgG: <1 AI
Thyroperoxidase Ab SerPl-aCnc: 1 IU/mL (ref <9)
ds DNA Ab: 3 IU/mL

## 2020-09-25 LAB — HLA-B27 ANTIGEN: HLA-B27 Antigen: NEGATIVE

## 2020-09-25 LAB — ANTI-NUCLEAR AB-TITER (ANA TITER): ANA Titer 1: 1:80 {titer} — ABNORMAL HIGH

## 2020-09-25 LAB — CYCLIC CITRUL PEPTIDE ANTIBODY, IGG: Cyclic Citrullin Peptide Ab: >250 UNITS — ABNORMAL HIGH

## 2020-09-25 NOTE — Progress Notes (Signed)
Right hand x-ray looks normal to radiology.  No significant arthritis changes or rheumatologic changes.

## 2020-09-25 NOTE — Progress Notes (Signed)
Left hand x-ray shows a little bit of arthritis at the base of the thumb but otherwise looks normal.  No rheumatologic changes visible.

## 2020-09-26 ENCOUNTER — Telehealth: Payer: Self-pay | Admitting: Family Medicine

## 2020-09-26 NOTE — Progress Notes (Signed)
CCP and rheumatoid factor are both strongly positive.  These are 2 labs that are significant for rheumatoid arthritis.  I think it is very likely that you may have rheumatoid arthritis.  ANA is mildly positive as well which may be associated as well.  Other labs are negative.  Based on these labs and your symptoms I have already referred you to rheumatology.  You should hear soon about scheduling.  If they cannot get you in in a timely manner please let me know.  There are some alternative locations like it sent to.  I do recommend that you proceed to the nerve conduction testing because these labs do not explain the numbness and tingling and reduced grip strength that you have necessarily.

## 2020-09-26 NOTE — Addendum Note (Signed)
Addended by: Rodolph Bong on: 09/26/2020 07:22 AM   Modules accepted: Orders

## 2020-09-26 NOTE — Telephone Encounter (Signed)
Patient called stating that he started having the same issues with his left hand as he was with his right. When he went to have his epidural today, he showed the doctor and he refused to do the epidural and told him to contact Dr Denyse Amass. He is scheduled for his nerve conduction test next week. He asked if he should keep this appointment or if he should have both arms done?  Please advise.

## 2020-09-27 NOTE — Telephone Encounter (Signed)
I called patient back and left a message advising him to get his testing done as ordered.  However I asked him to call back to answer some more questions.  We might want to schedule a time to talk.  I am committed this lunch time however.

## 2020-09-28 LAB — B. BURGDORFI ANTIBODIES BY WB

## 2020-09-28 LAB — LYME AB SCREEN %: Lyme AB Screen: 2.21 index — ABNORMAL HIGH

## 2020-09-29 NOTE — Progress Notes (Signed)
Screening Lyme disease test was positive but the follow along specific confirmatory test was not positive enough to be confirmatory for Lyme disease.  I do not think you have Lyme disease.

## 2020-10-03 ENCOUNTER — Ambulatory Visit: Payer: 59 | Admitting: Neurology

## 2020-10-03 ENCOUNTER — Other Ambulatory Visit: Payer: Self-pay

## 2020-10-03 DIAGNOSIS — M79641 Pain in right hand: Secondary | ICD-10-CM | POA: Diagnosis not present

## 2020-10-03 DIAGNOSIS — M79642 Pain in left hand: Secondary | ICD-10-CM

## 2020-10-03 DIAGNOSIS — M2550 Pain in unspecified joint: Secondary | ICD-10-CM

## 2020-10-03 DIAGNOSIS — R202 Paresthesia of skin: Secondary | ICD-10-CM

## 2020-10-03 DIAGNOSIS — M791 Myalgia, unspecified site: Secondary | ICD-10-CM

## 2020-10-03 NOTE — Procedures (Signed)
[facility]  [address] [place], [address]  [place], [place] [address] Tel: [phone] Fax:  [phone] Test Date:  10/03/2020  Patient: Neil Gilmore DOB: 01-12-1976 Physician: Nita Sickle, DO  Sex: Male Height: 5\' 10"  Ref Phys: , M.D.  ID#: [name]   Technician:    Patient Complaints: This is a 45 year old man referred for evaluation of bilateral arm pain and numbness.  NCV & EMG Findings: Extensive electrodiagnostic testing of the right upper extremity and additional studies of the left shows: 1. Bilateral median, ulnar, and mixed palmar sensory responses are within normal limits. 2. Bilateral median and ulnar motor responses are within normal limits. 3. There is no evidence of active or chronic motor axonal loss changes affecting any of the tested muscles.  Motor unit configuration and recruitment pattern is within normal limits. . Impression: This is a normal study of the upper extremities.  In particular, there is no evidence of carpal tunnel syndrome or a cervical radiculopathy.    ___________________________ 60, DO    Nerve Conduction Studies Anti Sensory Summary Table   Stim Site NR Peak (ms) Norm Peak (ms) P-T Amp (V) Norm P-T Amp  Left Median Anti Sensory (2nd Digit)  34C  Wrist    2.7 <3.4 33.0 >20  Right Median Anti Sensory (2nd Digit)  4C  Wrist    2.8 <3.4 37.4 >20  Left Ulnar Anti Sensory (5th Digit)  34C  Wrist    2.8 <3.1 31.9 >12  Right Ulnar Anti Sensory (5th Digit)  4C  Wrist    2.5 <3.1 36.3 >12   Motor Summary Table   Stim Site NR Onset (ms) Norm Onset (ms) O-P Amp (mV) Norm O-P Amp Site1 Site2 Delta-0 (ms) Dist (cm) Vel (m/s) Norm Vel (m/s)  Left Median Motor (Abd Poll Brev)  34C  Wrist    3.0 <3.9 10.4 >6 Elbow Wrist 5.2 29.0 56 >50  Elbow    8.2  10.2         Right Median Motor (Abd Poll Brev)  4C  Wrist    2.5 <3.9 10.2 >6 Elbow Wrist 5.3 29.0 55 >50  Elbow    7.8  9.3         Left Ulnar Motor  (Abd Dig Minimi)  34C  Wrist    2.1 <3.1 13.1 >7 B Elbow Wrist 3.5 24.0 69 >50  B Elbow    5.6  12.1  A Elbow B Elbow 1.6 10.0 62 >50  A Elbow    7.2  11.2         Right Ulnar Motor (Abd Dig Minimi)  4C  Wrist    2.0 <3.1 13.3 >7 B Elbow Wrist 3.9 25.0 64 >50  B Elbow    5.9  12.6  A Elbow B Elbow 1.6 10.0 63 >50  A Elbow    7.5  12.4          Comparison Summary Table   Stim Site NR Peak (ms) Norm Peak (ms) P-T Amp (V) Site1 Site2 Delta-P (ms) Norm Delta (ms)  Left Median/Ulnar Palm Comparison (Wrist - 8cm)  34C  Median Palm    1.5 <2.2 98.5 Median Palm Ulnar Palm 0.2   Ulnar Palm    1.7 <2.2 28.4      Right Median/Ulnar Palm Comparison (Wrist - 8cm)  4C  Median Palm    1.4 <2.2 100.1 Median Palm Ulnar Palm 0.0   Ulnar Palm    1.4 <2.2  25.2       EMG   Side Muscle Ins Act Fibs Psw Fasc Number Recrt Dur Dur. Amp Amp. Poly Poly. Comment  Right 1stDorInt Nml Nml Nml Nml Nml Nml Nml Nml Nml Nml Nml Nml N/A  Right PronatorTeres Nml Nml Nml Nml Nml Nml Nml Nml Nml Nml Nml Nml N/A  Right Biceps Nml Nml Nml Nml Nml Nml Nml Nml Nml Nml Nml Nml N/A  Right Triceps Nml Nml Nml Nml Nml Nml Nml Nml Nml Nml Nml Nml N/A  Right Deltoid Nml Nml Nml Nml Nml Nml Nml Nml Nml Nml Nml Nml N/A  Left 1stDorInt Nml Nml Nml Nml Nml Nml Nml Nml Nml Nml Nml Nml N/A  Left PronatorTeres Nml Nml Nml Nml Nml Nml Nml Nml Nml Nml Nml Nml N/A  Left Biceps Nml Nml Nml Nml Nml Nml Nml Nml Nml Nml Nml Nml N/A  Left Triceps Nml Nml Nml Nml Nml Nml Nml Nml Nml Nml Nml Nml N/A  Left Deltoid Nml Nml Nml Nml Nml Nml Nml Nml Nml Nml Nml Nml N/A      Waveforms:

## 2020-10-03 NOTE — Progress Notes (Signed)
Nerve conduction study is normal.  No evidence of carpal tunnel.  We will discuss all of your issues and results much more at scheduled follow-up.

## 2020-10-25 NOTE — Progress Notes (Signed)
I, Peterson Lombard, LAT, ATC acting as a scribe for Lynne Leader, MD.  Neil Gilmore is a 45 y.o. male who presents to [place] [facility] today for f/u bilat hand and wrist pain/stiffness associated with bilateral shoulder soreness, hand paresthesias, and diffuse myalgias and arthralgias. Pt was last seen by Dr. Georgina Snell on 09/18/20 and was referred to rheumatology based on positive lab findings, however pt his 1st visit isn't until 7/20. Pt was advised to proceed to NCV study and was referred to hand PT at [facility]. Today, pt reports having worse days since he was seen last. Pt reports Dr. Sherryle Lis refused to do the neck injections as his symptoms are thought to be due to rheumatology issues and did not want to confound future diagnosis with steroids.  Pt now also c/o bilat knee pain w/ mechanical symptoms and R ankle pain/swelling. Pt notes that his hand pain is to the point where he is unable to open a door knob and is starting to drop things.   Dx testing: 10/03/20 NCV study 09/21/20 Labs 09/21/20 R & L hand XR 09/03/20 C-spine MRI 10/14/19 L-spine MRI  Pertinent review of systems: No fevers or chills  Relevant historical information: Hyperlipidemia   Exam:  BP 128/88   Pulse 94   Ht 5' 10"  (1.778 m)   Wt 215 lb 12.8 oz (97.9 kg)   SpO2 98%   BMI 30.96 kg/m  General: Well Developed, well nourished, and in no acute distress.   MSK: Hands bilaterally swollen across MCPs tender to palpation decreased motion.    Lab and Radiology Results Recent Results (from the past 2160 hour(s))  TSH     Status: None   Collection Time: 09/14/20 10:05 AM  Result Value Ref Range   TSH 1.46 0.35 - 4.50 uIU/mL  HIV Antibody (routine testing w rflx)     Status: None   Collection Time: 09/14/20 10:05 AM  Result Value Ref Range   HIV 1&2 Ab, 4th Generation NON-REACTIVE NON-REACTIVE    Comment: HIV-1 antigen and HIV-1/HIV-2 antibodies were not detected. There is no  laboratory evidence of HIV infection. Marland Kitchen PLEASE NOTE: This information has been disclosed to you from records whose confidentiality may be protected by state law.  If your state requires such protection, then the state law prohibits you from making any further disclosure of the information without the specific written consent of the person to whom it pertains, or as otherwise permitted by law. A general authorization for the release of medical or other information is NOT sufficient for this purpose. . For additional information please refer to http://education.questdiagn[facility].com/faq/FAQ106 (This link is being provided for informational/ educational purposes only.) . Marland Kitchen The performance of this assay has not been clinically validated in patients less than 74 years old. .   Hepatitis C antibody     Status: None   Collection Time: 09/14/20 10:05 AM  Result Value Ref Range   Hepatitis C Ab NON-REACTIVE NON-REACTIVE   SIGNAL TO CUT-OFF 0.01 <1.00    Comment: . HCV antibody was non-reactive. There is no laboratory  evidence of HCV infection. . In most cases, no further action is required. However, if recent HCV exposure is suspected, a test for HCV RNA (test code [phone]) is suggested. . For additional information please refer to http://education.[facility].com/faq/FAQ22v1 (This link is being provided for informational/ educational purposes only.) .   Lipid panel     Status: Abnormal   Collection Time: 09/14/20 10:05 AM  Result Value Ref Range   Cholesterol 250 (H) 0 - 200 mg/dL    Comment: ATP III Classification       Desirable:  < 200 mg/dL               Borderline High:  200 - 239 mg/dL          High:  > = 240 mg/dL   Triglycerides 170.0 (H) 0.0 - 149.0 mg/dL    Comment: Normal:  <150 mg/dLBorderline High:  150 - 199 mg/dL   HDL 31.40 (L) >39.00 mg/dL   VLDL 34.0 0.0 - 40.0 mg/dL   LDL Cholesterol 184 (H) 0 - 99 mg/dL   Total CHOL/HDL Ratio 8     Comment:                 Men          Women1/2 Average Risk     3.4          3.3Average Risk          5.0          4.42X Average Risk          9.6          7.13X Average Risk          15.0          11.0                       NonHDL 218.14     Comment: NOTE:  Non-HDL goal should be 30 mg/dL higher than patient's LDL goal (i.e. LDL goal of < 70 mg/dL, would have non-HDL goal of < 709 mg/dL)  Cyclic citrul peptide antibody, IgG     Status: Abnormal   Collection Time: 09/21/20  1:48 PM  Result Value Ref Range   Cyclic Citrullin Peptide Ab >250 (H) UNITS    Comment: Reference Range Negative:            <20 Weak Positive:       20-39 Moderate Positive:   40-59 Strong Positive:     >59 .   HLA-B27 antigen     Status: None   Collection Time: 09/21/20  1:48 PM  Result Value Ref Range   HLA-B27 Antigen NEGATIVE NEGATIVE  Uric acid     Status: None   Collection Time: 09/21/20  1:48 PM  Result Value Ref Range   Uric Acid, Serum 6.4 4.0 - 7.8 mg/dL  Sedimentation rate     Status: Abnormal   Collection Time: 09/21/20  1:48 PM  Result Value Ref Range   Sed Rate 32 (H) 0 - 15 mm/hr  CK     Status: None   Collection Time: 09/21/20  1:48 PM  Result Value Ref Range   Total CK 91 7 - 232 U/L  LUPUS(12) PANEL     Status: Abnormal   Collection Time: 09/21/20  1:48 PM  Result Value Ref Range   Anti Nuclear Antibody (ANA) POSITIVE (A) NEGATIVE    Comment: ANA IFA is a first line screen for detecting the presence of up to approximately 150 autoantibodies in various autoimmune diseases. A positive ANA IFA result is suggestive of autoimmune disease and reflexes to titer and pattern. Further laboratory testing may be considered if clinically indicated. . For additional information, please refer to http://education.[facility].com/faq/FAQ177 (This link is being provided for informational/ educational purposes only.) .    C3 Complement 167 82 - 185 mg/dL  C4 Complement 20 15 - 53 mg/dL   ds DNA Ab 3 IU/mL     Comment:                            IU/mL       Interpretation                            < or = 4    Negative                            5-9         Indeterminate                            > or = 10   Positive .    Ribosomal P Protein Ab <1.0 NEG <1.0 NEG AI   ENA SM Ab Ser-aCnc <1.0 NEG <1.0 NEG AI   SM/RNP <1.0 NEG <1.0 NEG AI   SSA (Ro) (ENA) Antibody, IgG <1.0 NEG <1.0 NEG AI   SSB (La) (ENA) Antibody, IgG <1.0 NEG <1.0 NEG AI   Thyroperoxidase Ab SerPl-aCnc 1 <9 IU/mL   Scleroderma (Scl-70) (ENA) Antibody, IgG <1.0 NEG <1.0 NEG AI   Rhuematoid fact SerPl-aCnc 66 (H) <14 IU/mL  Anti-nuclear ab-titer (ANA titer)     Status: Abnormal   Collection Time: 09/21/20  1:48 PM  Result Value Ref Range   ANA Titer 1 1:80 (H) titer    Comment: A low level ANA titer may be present in pre-clinical autoimmune diseases and normal individuals.                 Reference Range                 <1:40        Negative                 1:40-1:80    Low Antibody Level                 >1:80        Elevated Antibody Level .    ANA Pattern 1 Nuclear, Homogeneous (A)     Comment: Homogeneous pattern is associated with systemic lupus erythematosus (SLE), drug-induced lupus and juvenile idiopathic arthritis. . AC-1: Homogeneous . International Consensus on ANA Patterns (https://www.hernandez-brewer.com/)   LYME AB SCREEN %     Status: Abnormal   Collection Time: 09/21/20  1:48 PM  Result Value Ref Range   Lyme AB Screen 2.21 (H) index    Comment:                    Index                Interpretation                    -----                --------------                    < 0.90               Negative                    0.90-1.09  Equivocal                    > 1.09               Positive . As recommended by the Food and Drug Administration  (FDA), all samples with positive or equivocal  results in a Borrelia burgdorferi antibody screen will be tested using a blot method.  Positive or  equivocal screening test results should not be  interpreted as truly positive until verified as such  using a supplemental assay (e.g., B. burgdorferi blot). . The screening test and/or blot for B. burgdorferi  antibodies may be falsely negative in early stages of Lyme disease, including the period when erythema  migrans is apparent. Evonnie Pat antibodies by WB     Status: Abnormal   Collection Time: 09/21/20  1:48 PM  Result Value Ref Range   B burgdorferi IgG Abs (IB) NEGATIVE NEGATIVE   Lyme Disease 18 kD IgG NON-REACTIVE    Lyme Disease 23 kD IgG NON-REACTIVE    Lyme Disease 28 kD IgG NON-REACTIVE    Lyme Disease 30 kD IgG NON-REACTIVE    Lyme Disease 39 kD IgG NON-REACTIVE    Lyme Disease 41 kD IgG REACTIVE (A)    Lyme Disease 45 kD IgG NON-REACTIVE    Lyme Disease 58 kD IgG NON-REACTIVE    Lyme Disease 66 kD IgG NON-REACTIVE    Lyme Disease 93 kD IgG NON-REACTIVE    B burgdorferi IgM Abs (IB) NEGATIVE NEGATIVE   Lyme Disease 23 kD IgM NON-REACTIVE    Lyme Disease 39 kD IgM NON-REACTIVE    Lyme Disease 41 kD IgM NON-REACTIVE     Comment: As per CDC criteria, a Lyme disease IgG Immunoblot must show reactivity to at least 5 of 10 specific borrelial proteins to be considered positive; similarly, a  positive Lyme disease IgM immunoblot requires reactivity to 2 of 3 specific borrelial proteins. Although considered negative, IgG reactivity to fewer specific borrelial proteins or IgM reactivity to only 1 protein may indicate recent B. burgdorferi infection and warrant testing of a later sample. A positive IgM but negative IgG result obtained more than a month after onset of symptoms likely represents a false- positive IgM result rather than acute Lyme disease. In rare instances, Lyme disease immunoblot reactivity may represent antibodies induced by exposure to other spirochetes.  . Lyme immunoblot testing should only be performed on samples from patients  who have had a Positive or Equivocal result in a screening assay.      MR CERVICAL SPINE WO CONTRAST  Result Date: 09/03/2020 CLINICAL DATA:  Initial evaluation for bilateral hand numbness and weakness, pain from right onto shoulder. EXAM: MRI CERVICAL SPINE WITHOUT CONTRAST TECHNIQUE: Multiplanar, multisequence MR imaging of the cervical spine was performed. No intravenous contrast was administered. COMPARISON:  None. FINDINGS: Alignment: Vertebral bodies normally aligned with preservation of the normal cervical lordosis. No listhesis. Vertebrae: Vertebral body height maintained without acute or chronic fracture. Bone marrow signal intensity within normal limits. 1 cm benign hemangioma noted within the C5 vertebral body. No other discrete or worrisome osseous lesions. No abnormal marrow edema. Cord: Normal signal and morphology. Posterior Fossa, vertebral arteries, paraspinal tissues: Visualized brain and posterior fossa within normal limits. Craniocervical junction normal. Paraspinous and prevertebral soft tissues within normal limits. Normal intravascular flow voids seen within the vertebral arteries bilaterally. Disc levels: C2-C3: Unremarkable. C3-C4:  Unremarkable. C4-C5: Minimal disc bulge mildly indents the ventral  thecal sac, slightly asymmetric to the right. No spinal stenosis or significant cord deformity. Foramina remain patent. C5-C6: Diffuse disc bulge with bilateral uncovertebral hypertrophy. Flattening and partial effacement of the ventral thecal sac with resultant mild spinal stenosis, but no cord impingement. Mild right C6 foraminal narrowing. No significant left foraminal encroachment. C6-C7: Degenerative intervertebral disc space narrowing. Broad-based left paracentral disc osteophyte complex indents the left ventral thecal sac (series 7, image 26). Associated annular fissure. Mild spinal stenosis with mild flattening of the left ventral cord, but no cord signal changes. Moderate left  worse than right C7 foraminal narrowing. C7-T1: Small central disc protrusion indents the ventral thecal sac, eccentric to the right (series 6, image 27). No spinal stenosis or cord deformity. Mild facet hypertrophy. No spinal stenosis. T1-2: Small left paracentral disc protrusion indents the ventral thecal sac (series 7, image 34), partially visualized. No significant spinal stenosis. Foramina remain patent. IMPRESSION: 1. Broad-based left paracentral disc osteophyte complex at C6-7 with resultant mild spinal stenosis, with moderate left worse than right C7 foraminal narrowing. 2. Disc bulge with uncovertebral hypertrophy at C5-6 with resultant mild canal and right C6 foraminal stenosis. 3. Small central and left paracentral disc protrusions at C7-T1 and T1-2 without significant stenosis. Electronically Signed   By: Jeannine Boga M.D.   On: 09/03/2020 18:52   DG Hand Complete Left  Result Date: 09/23/2020 CLINICAL DATA:  Pain. EXAM: LEFT HAND - COMPLETE 3+ VIEW COMPARISON:  None. FINDINGS: Osseous alignment is normal. Bone mineralization is normal. No fracture line or displaced fracture fragment. No acute-appearing cortical irregularity or osseous lesion. No appreciable degenerative change. Perhaps minimal spurring at the first [facility] joint. IMPRESSION: 1. No acute findings. 2. Perhaps minimal degenerative spurring at the first [facility] joint. No other appreciable degenerative change. Electronically Signed   By: Franki Cabot M.D.   On: 09/23/2020 12:12   DG Hand Complete Right  Result Date: 09/23/2020 CLINICAL DATA:  RIGHT wrist pain since January.  Worsening pain. EXAM: RIGHT HAND - COMPLETE 3+ VIEW COMPARISON:  None. FINDINGS: Osseous alignment is normal. Bone mineralization is normal. No fracture line or displaced fracture fragment. No acute-appearing cortical irregularity or osseous lesion. No appreciable degenerative change. IMPRESSION: Negative. Electronically Signed   By: Franki Cabot M.D.   On:  09/23/2020 12:09   NCV with EMG(electromyography)  Result Date: 10/03/2020 Alda Berthold, DO     10/03/2020  2:59 PM [facility] [place], [place]  [place], [place] [address] Tel: [phone] Fax:  [phone] Test Date:  10/03/2020 Patient: Neil Gilmore DOB: [birth year] Physician: Narda Amber, DO Sex: Male Height: 5' 10"  Ref Phys: Lynne Leader, M.D. ID#: [id number]   Technician:  Patient Complaints: This is a 45 year old man referred for evaluation of bilateral arm pain and numbness. NCV & EMG Findings: Extensive electrodiagnostic testing of the right upper extremity and additional studies of the left shows: 1. Bilateral median, ulnar, and mixed palmar sensory responses are within normal limits. 2. Bilateral median and ulnar motor responses are within normal limits. 3. There is no evidence of active or chronic motor axonal loss changes affecting any of the tested muscles.  Motor unit configuration and recruitment pattern is within normal limits. . Impression: This is a normal study of the upper extremities.  In particular, there is no evidence of carpal tunnel syndrome or a cervical radiculopathy. ___________________________ Narda Amber, DO Nerve Conduction Studies Anti Sensory Summary Table  Stim Site NR Peak (ms) Norm Peak (ms) P-T  Amp (V) Norm P-T Amp Left Median Anti Sensory (2nd Digit)  34C Wrist    2.7 <3.4 33.0 >20 Right Median Anti Sensory (2nd Digit)  4C Wrist    2.8 <3.4 37.4 >20 Left Ulnar Anti Sensory (5th Digit)  34C Wrist    2.8 <3.1 31.9 >12 Right Ulnar Anti Sensory (5th Digit)  4C Wrist    2.5 <3.1 36.3 >12 Motor Summary Table  Stim Site NR Onset (ms) Norm Onset (ms) O-P Amp (mV) Norm O-P Amp Site1 Site2 Delta-0 (ms) Dist (cm) Vel (m/s) Norm Vel (m/s) Left Median Motor (Abd Poll Brev)  34C Wrist    3.0 <3.9 10.4 >6 Elbow Wrist 5.2 29.0 56 >50 Elbow    8.2  10.2        Right Median Motor (Abd Poll Brev)  4C Wrist    2.5 <3.9 10.2 >6 Elbow Wrist 5.3 29.0 55 >50 Elbow     7.8  9.3        Left Ulnar Motor (Abd Dig Minimi)  34C Wrist    2.1 <3.1 13.1 >7 B Elbow Wrist 3.5 24.0 69 >50 B Elbow    5.6  12.1  A Elbow B Elbow 1.6 10.0 62 >50 A Elbow    7.2  11.2        Right Ulnar Motor (Abd Dig Minimi)  4C Wrist    2.0 <3.1 13.3 >7 B Elbow Wrist 3.9 25.0 64 >50 B Elbow    5.9  12.6  A Elbow B Elbow 1.6 10.0 63 >50 A Elbow    7.5  12.4        Comparison Summary Table  Stim Site NR Peak (ms) Norm Peak (ms) P-T Amp (V) Site1 Site2 Delta-P (ms) Norm Delta (ms) Left Median/Ulnar Palm Comparison (Wrist - 8cm)  34C Median Palm    1.5 <2.2 98.5 Median Palm Ulnar Palm 0.2  Ulnar Palm    1.7 <2.2 28.4     Right Median/Ulnar Palm Comparison (Wrist - 8cm)  4C Median Palm    1.4 <2.2 100.1 Median Palm Ulnar Palm 0.0  Ulnar Palm    1.4 <2.2 25.2     EMG  Side Muscle Ins Act Fibs Psw Fasc Number Recrt Dur Dur. Amp Amp. Poly Poly. Comment Right 1stDorInt Nml Nml Nml Nml Nml Nml Nml Nml Nml Nml Nml Nml N/A Right PronatorTeres Nml Nml Nml Nml Nml Nml Nml Nml Nml Nml Nml Nml N/A Right Biceps Nml Nml Nml Nml Nml Nml Nml Nml Nml Nml Nml Nml N/A Right Triceps Nml Nml Nml Nml Nml Nml Nml Nml Nml Nml Nml Nml N/A Right Deltoid Nml Nml Nml Nml Nml Nml Nml Nml Nml Nml Nml Nml N/A Left 1stDorInt Nml Nml Nml Nml Nml Nml Nml Nml Nml Nml Nml Nml N/A Left PronatorTeres Nml Nml Nml Nml Nml Nml Nml Nml Nml Nml Nml Nml N/A Left Biceps Nml Nml Nml Nml Nml Nml Nml Nml Nml Nml Nml Nml N/A Left Triceps Nml Nml Nml Nml Nml Nml Nml Nml Nml Nml Nml Nml N/A Left Deltoid Nml Nml Nml Nml Nml Nml Nml Nml Nml Nml Nml Nml N/A Waveforms:               I, Lynne Leader, personally (independently) visualized and performed the interpretation of the MRI and Xray images attached in this note.      Assessment and Plan: 45 y.o. male with polyarthralgias thought to be related to rheumatoid arthritis or other related rheumatologic  diagnosis. Patient had significantly positive rheumatoid factor and CCP and mildly elevated ANA.   Sedimentation rate was also elevated.  Nerve conduction study was normal.  At this point patient very likely has rheumatologic disease and has been already referred to [facility] rheumatology with an appointment scheduled for July 20.  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  The obvious medical treatment would be steroids at this point at least temporarily however that we will potentially interfere with work-up and future diagnosis with rheumatology in about a month.  Additionally steroids are not a good long-term solution.  We will try to get him in with [facility] a little sooner and if not possible there are other options.  Additionally I may discuss the case with rheumatology and start treatment on my own and complete further work-up if needed.  Discussed situation with patient expresses understanding and agreement.   PDMP not reviewed this encounter. Orders Placed This Encounter  Procedures   Ambulatory referral to Rheumatology    Referral Priority:   Routine    Referral Type:   Consultation    Referral Reason:   Specialty Services Required    Requested Specialty:   Rheumatology    Number of Visits Requested:   1   No orders of the defined types were placed in this encounter.    Discussed warning signs or symptoms. Please see discharge instructions. Patient expresses understanding.   The above documentation has been reviewed and is accurate and complete Lynne Leader, M.D.  Total encounter time 30 minutes including face-to-face time with the patient and, reviewing past medical record, and charting on the date of service.   Treatment plan and options

## 2020-10-26 ENCOUNTER — Ambulatory Visit: Payer: 59 | Admitting: Family Medicine

## 2020-10-26 ENCOUNTER — Other Ambulatory Visit: Payer: Self-pay

## 2020-10-26 VITALS — BP 128/88 | HR 94 | Ht 70.0 in | Wt 215.8 lb

## 2020-10-26 DIAGNOSIS — R768 Other specified abnormal immunological findings in serum: Secondary | ICD-10-CM

## 2020-10-26 DIAGNOSIS — M79642 Pain in left hand: Secondary | ICD-10-CM

## 2020-10-26 DIAGNOSIS — M79641 Pain in right hand: Secondary | ICD-10-CM

## 2020-10-26 DIAGNOSIS — R7989 Other specified abnormal findings of blood chemistry: Secondary | ICD-10-CM | POA: Diagnosis not present

## 2020-10-26 NOTE — Patient Instructions (Addendum)
Thank you for coming in today.   This looks like a rheumatology problem based on the labs.   I will try to get you in with an external rheumatology group sooner than your appointment on July 20.   Let me know if you cant get in sooner.

## 2020-11-02 ENCOUNTER — Telehealth: Payer: Self-pay | Admitting: Family Medicine

## 2020-11-02 DIAGNOSIS — M2550 Pain in unspecified joint: Secondary | ICD-10-CM

## 2020-11-02 DIAGNOSIS — R768 Other specified abnormal immunological findings in serum: Secondary | ICD-10-CM

## 2020-11-02 DIAGNOSIS — M79642 Pain in left hand: Secondary | ICD-10-CM

## 2020-11-02 NOTE — Telephone Encounter (Signed)
Backup plan referral #2.  Different referral location.  Please let me know if this does not work.

## 2020-11-02 NOTE — Telephone Encounter (Signed)
Patient called to let Dr Denyse Amass know that he is scheduled for his appointment with rheumatology - Dr Corliss Skains on 11/22/2020. The other office he contacted said they could not see him until September. He asked if there any anything he could do in the mean time.  Please advise.

## 2020-11-02 NOTE — Telephone Encounter (Signed)
Appointment scheduled 11/09/2020 at 8:30

## 2020-11-02 NOTE — Telephone Encounter (Signed)
I called [facility]. They have openings next week but are not able to schedule without the referral first. Referral has been faxed and patient is aware.

## 2020-11-09 NOTE — Progress Notes (Deleted)
Office Visit Note  Patient: Neil Gilmore             Date of Birth: 1976/02/10           MRN: [id number]             PCP: Neil Lima, MD Referring: Neil Hams, MD Visit Date: 11/22/2020 Occupation: _0 @  Subjective:  No chief complaint on file.   History of Present Illness: Neil Gilmore is a 45 y.o. male ***   Activities of Daily Living:  Patient reports morning stiffness for *** {minute/hour:19697}.   Patient {ACTIONS;DENIES/REPORTS:21021675::"Denies"} nocturnal pain.  Difficulty dressing/grooming: {ACTIONS;DENIES/REPORTS:21021675::"Denies"} Difficulty climbing stairs: {ACTIONS;DENIES/REPORTS:21021675::"Denies"} Difficulty getting out of chair: {ACTIONS;DENIES/REPORTS:21021675::"Denies"} Difficulty using hands for taps, buttons, cutlery, and/or writing: {ACTIONS;DENIES/REPORTS:21021675::"Denies"}  No Rheumatology ROS completed.   PMFS History:  Patient Active Problem List   Diagnosis Date Noted   Routine general medical examination at a health care facility 09/14/2020   Chronic wrist pain, right 09/14/2020   Hyperlipidemia with target LDL less than 130 09/14/2020    Past Medical History:  Diagnosis Date   Arthritis    Asthma    Frequent headaches    GERD (gastroesophageal reflux disease)     Family History  Problem Relation Age of Onset   Arthritis Mother    Hypertension Mother    Aneurysm Father    Hypertension Brother    Heart attack Maternal Grandfather    Past Surgical History:  Procedure Laterality Date   BACK SURGERY     L4,5,6    HERNIA REPAIR     WISDOM TOOTH EXTRACTION     Social History   Social History Narrative   Not on file   Immunization History  Administered Date(s) Administered   Moderna Sars-Covid-2 Vaccination 05/10/2019, 06/07/2019, 04/06/2020   Tdap 09/14/2020     Objective: Vital Signs: There were no vitals taken for this visit.   Physical Exam   Musculoskeletal Exam: ***  CDAI Exam: CDAI Score:  -- Patient Global: --; Provider Global: -- Swollen: --; Tender: -- Joint Exam 11/22/2020   No joint exam has been documented for this visit   There is currently no information documented on the homunculus. Go to the Rheumatology activity and complete the homunculus joint exam.  Investigation: No additional findings.  Imaging: No results found.  Recent Labs: Lab Results  Component Value Date   WBC 3.9 06/22/2020   HGB 16.2 06/22/2020   PLT 232 06/22/2020   NA 138 06/22/2020   K 4.5 06/22/2020   CL 103 06/22/2020   CO2 30 (A) 06/22/2020   BUN 13 06/22/2020   CREATININE 1.0 06/22/2020   ALKPHOS 30 06/22/2020   AST 22 06/22/2020   ALT 34 06/22/2020   ALBUMIN 4.5 06/22/2020   CALCIUM 9.3 06/22/2020    Speciality Comments: No specialty comments available.  Procedures:  No procedures performed Allergies: Penicillins and Tetracyclines & related   Assessment / Plan:     Visit Diagnoses: Polyarthralgia  Positive anti-CCP test  Rheumatoid factor positive  Positive ANA (antinuclear antibody) - 09/21/20: ANA 1:80NH, anti-CCP>250, HLA-B27 negative, uric acid 6.4, ESR 32, CK 91, ENA negative, dsDNA 3, complements WNL, lyme ab+  Hyperlipidemia with target LDL less than 130  Orders: No orders of the defined types were placed in this encounter.  No orders of the defined types were placed in this encounter.   Face-to-face time spent with patient was *** minutes. Greater than 50% of time was spent in counseling and coordination of  care.  Follow-Up Instructions: No follow-ups on file.   Ofilia Neas, PA-C  Note - This record has been created using Dragon software.  Chart creation errors have been sought, but may not always  have been located. Such creation errors do not reflect on  the standard of medical care.

## 2020-11-22 ENCOUNTER — Ambulatory Visit: Payer: 59 | Admitting: Rheumatology

## 2020-12-14 ENCOUNTER — Ambulatory Visit: Payer: 59 | Admitting: Rheumatology

## 2021-05-08 ENCOUNTER — Telehealth: Payer: Self-pay | Admitting: Internal Medicine

## 2021-05-08 NOTE — Telephone Encounter (Signed)
Connected to [facility] 1.2.2023.  Caller is calling for her husband who tested positive for COVID, has RA. Wants to know if the doctor is able to call in the paxlovid medication. Fever, coughing, join pain, exhaustion.Wants to see if there is any relieve medication she can give him.  Advised to call PCP within 24 hours.

## 2021-05-09 ENCOUNTER — Encounter: Payer: Self-pay | Admitting: Internal Medicine

## 2021-05-09 ENCOUNTER — Telehealth: Payer: 59 | Admitting: Internal Medicine

## 2021-05-09 VITALS — Temp 98.8°F

## 2021-05-09 DIAGNOSIS — U071 COVID-19: Secondary | ICD-10-CM | POA: Diagnosis not present

## 2021-05-09 NOTE — Progress Notes (Signed)
Virtual Visit via Telephone Note  I connected with Neil Gilmore on 05/09/21 at  4:00 PM EST by telephone and verified that I am speaking with the correct person using two identifiers.   I discussed the limitations, risks, security and privacy concerns of performing an evaluation and management service by telephone and the availability of in person appointments. I also discussed with the patient that there may be a patient responsible charge related to this service. The patient expressed understanding and agreed to proceed.  Location patient: home Location provider: work office Participants present for the call: patient, provider Patient did not have a visit in the prior 7 days to address this/these issue(s).  We initially attempted to connect via video chat but were unable to due to technical difficulties on the patient's end, so we converted this visit to a phone visit.    History of Present Illness:  He has called to inform us that he tested positive for COVID.  On Saturday afternoon he started feeling very fatigued, on that day he took a COVID test that was negative.  The next morning he woke up ill with a fever of 103, headache, severe fatigue, dry cough and congestion.  That day his COVID test was positive.  We are seeing him 4 days into his illness.  He feels like over the past 24 hours he has significantly improved, no longer has a headache, fever has broken.  He still feels fatigued.  It is important to note that he was recently diagnosed with rheumatoid arthritis and is immunosuppressed on methotrexate and Humira.   Observations/Objective: Patient sounds congested I do not appreciate any increased work of breathing. Speech and thought processing are grossly intact. Patient reported vitals: None reported   Current Outpatient Medications:    acetaminophen (TYLENOL) 325 MG tablet, Take 650 mg by mouth every 6 (six) hours as needed., Disp: , Rfl:    ibuprofen (ADVIL) 200 MG  tablet, Take 200 mg by mouth every 6 (six) hours as needed., Disp: , Rfl:   Review of Systems:  Constitutional: Positive for fever, chills, diaphoresis, appetite change and fatigue.  HEENT: Denies photophobia, eye pain, redness, hearing loss, mouth sores, trouble swallowing, neck pain, neck stiffness and tinnitus.   Respiratory: Denies SOB, DOE, chest tightness,  and wheezing.   Cardiovascular: Denies chest pain, palpitations and leg swelling.  Gastrointestinal: Denies nausea, vomiting, abdominal pain, diarrhea, constipation, blood in stool and abdominal distention.  Genitourinary: Denies dysuria, urgency, frequency, hematuria, flank pain and difficulty urinating.  Endocrine: Denies: hot or cold intolerance, sweats, changes in hair or nails, polyuria, polydipsia. Musculoskeletal: Denies myalgias, back pain, joint swelling, arthralgias and gait problem.  Skin: Denies pallor, rash and wound.  Neurological: Denies dizziness, seizures, syncope, weakness, light-headedness, numbness and headaches.  Hematological: Denies adenopathy. Easy bruising, personal or family bleeding history  Psychiatric/Behavioral: Denies suicidal ideation, mood changes, confusion, nervousness, sleep disturbance and agitation   Assessment and Plan:  COVID-19  -As he is 4 days into his disease course and his positive test and his symptoms are already improving, I think it is okay to observe him at this point.  He is probably outside of the window for antivirals. -He may use over-the-counter symptomatic treatments such as occasional use of Tylenol, guaifenesin, antihistamines as needed. -He knows to contact us if symptoms worsen or fail to improve.  I discussed the assessment and treatment plan with the patient. The patient was provided an opportunity to ask questions and all were  answered. The patient agreed with the plan and demonstrated an understanding of the instructions.   The patient was advised to call back or  seek an in-person evaluation if the symptoms worsen or if the condition fails to improve as anticipated.  I provided 13 minutes of non-face-to-face time during this encounter.   Lelon Frohlich, MD [facility]

## 2021-06-06 ENCOUNTER — Ambulatory Visit (INDEPENDENT_AMBULATORY_CARE_PROVIDER_SITE_OTHER): Payer: 59

## 2021-06-06 ENCOUNTER — Encounter: Payer: Self-pay | Admitting: Internal Medicine

## 2021-06-06 ENCOUNTER — Ambulatory Visit: Payer: 59 | Admitting: Internal Medicine

## 2021-06-06 ENCOUNTER — Other Ambulatory Visit: Payer: Self-pay

## 2021-06-06 VITALS — BP 116/80 | HR 78 | Temp 97.9°F | Resp 16 | Ht 70.0 in | Wt 215.0 lb

## 2021-06-06 DIAGNOSIS — R052 Subacute cough: Secondary | ICD-10-CM

## 2021-06-06 DIAGNOSIS — Z23 Encounter for immunization: Secondary | ICD-10-CM

## 2021-06-06 IMAGING — DX DG CHEST 2V
2 series · 2 of 2 positions shown · non-contrast
Comparison: [DATE]

CLINICAL DATA: Nonproductive cough for 4 weeks. Recent [JO]
infection.

EXAM:
CHEST - 2 VIEW

[chest pa]
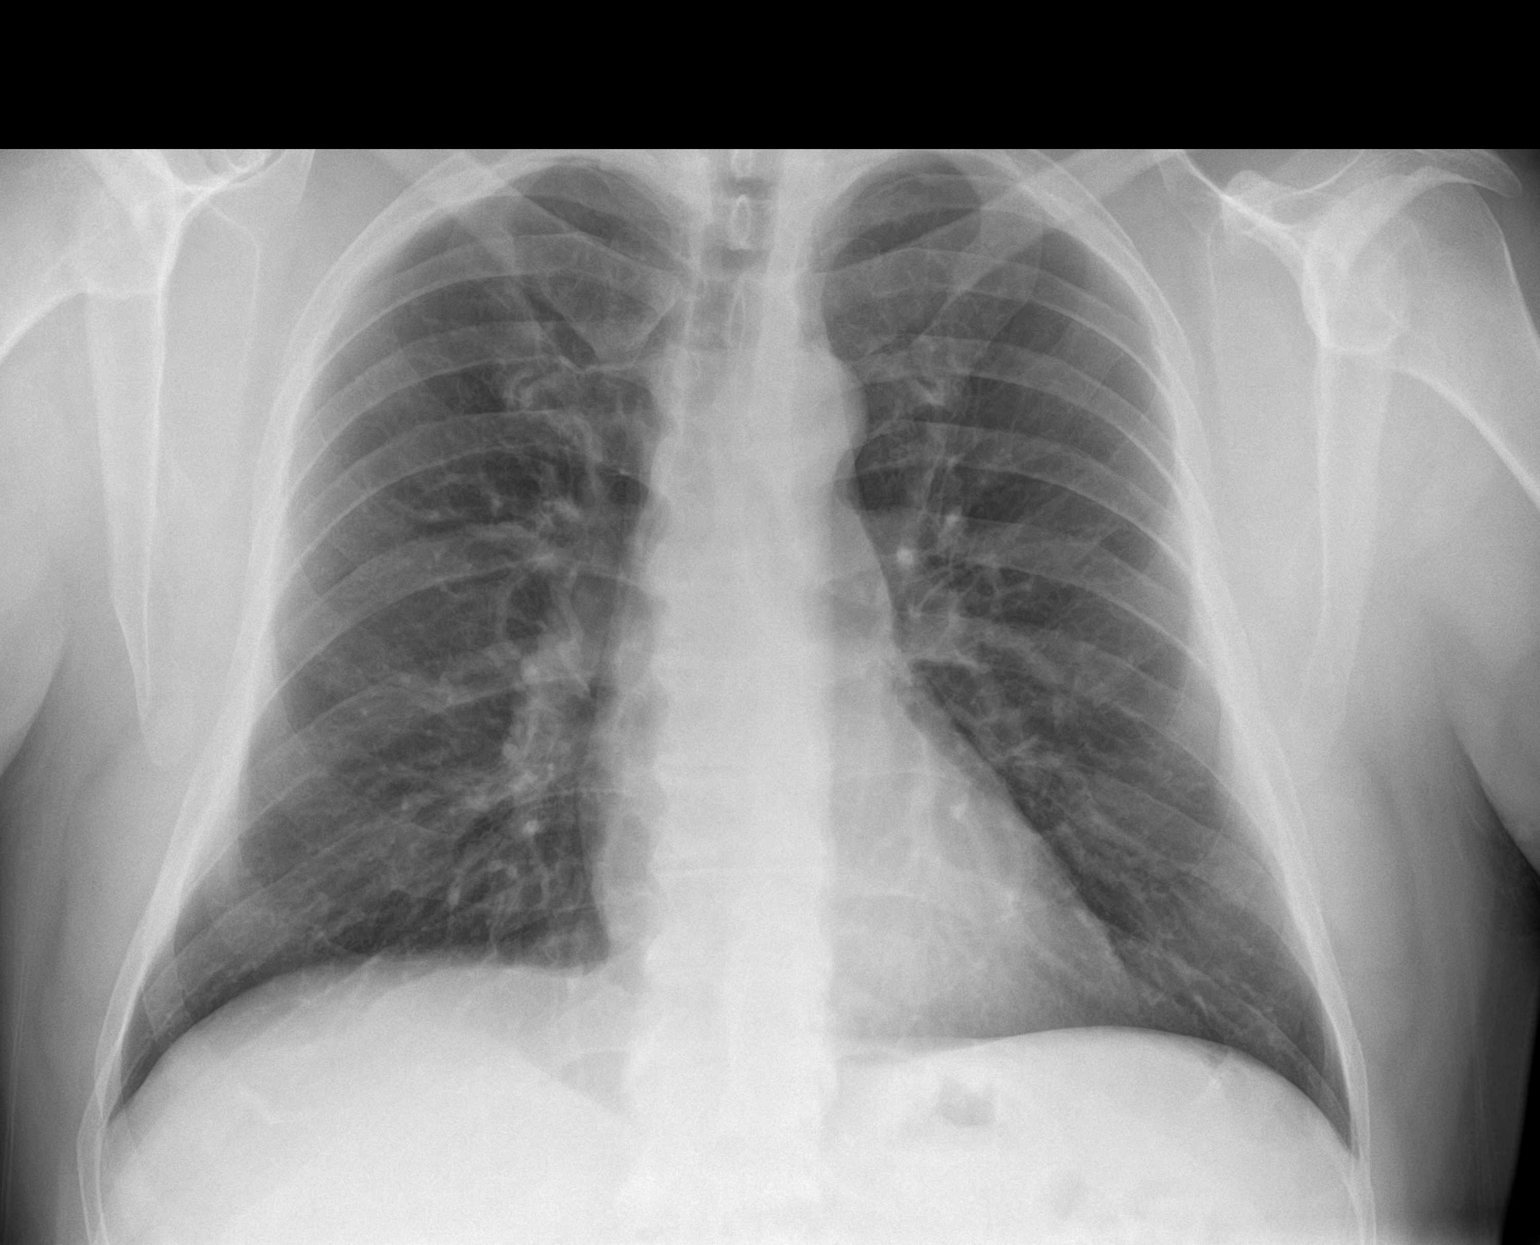

[chest lat]
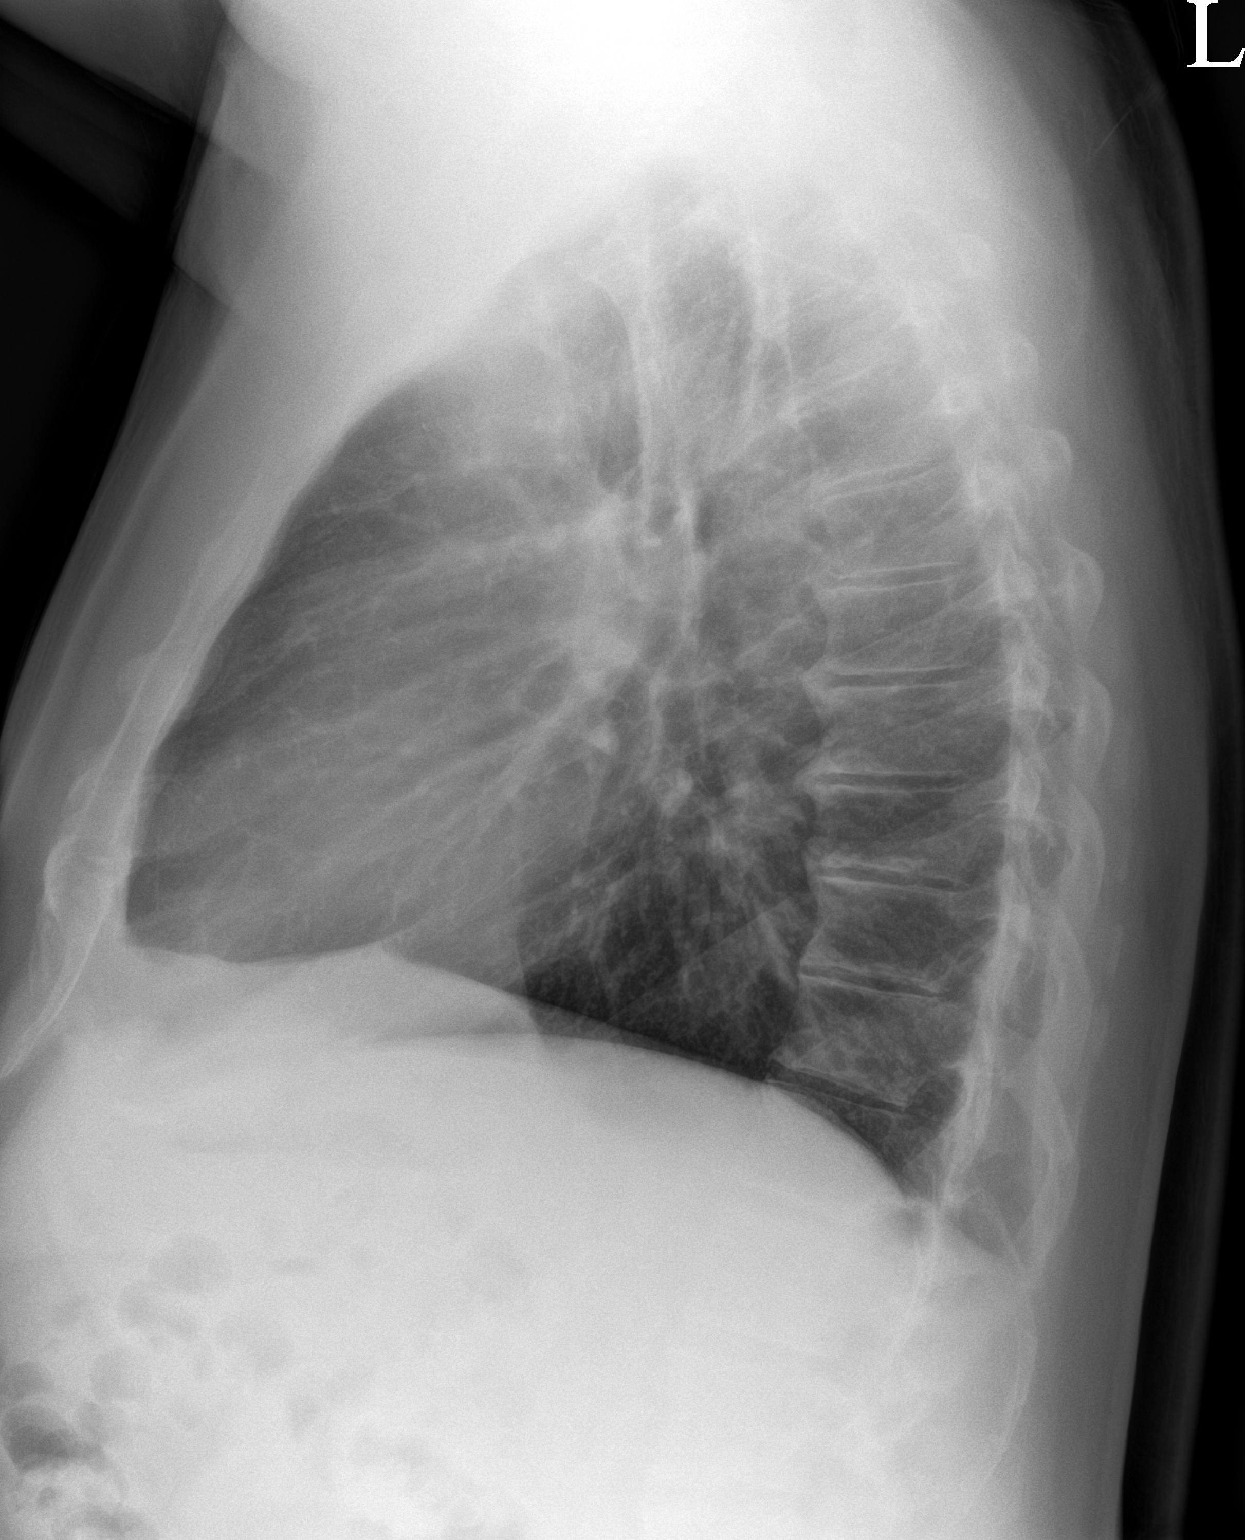

[2 of 2 positions shown; findings below may reference images not displayed]

FINDINGS: The heart size and mediastinal contours are within normal limits.
Both lungs are clear. The visualized skeletal structures are
unremarkable.
IMPRESSION: No active cardiopulmonary disease.

## 2021-06-06 NOTE — Patient Instructions (Signed)

## 2021-06-06 NOTE — Progress Notes (Signed)
Subjective:  Patient ID: Neil Gilmore, male    DOB: [birth year]  Age: 46 y.o. MRN: [id number]  CC: Cough  This visit occurred during the SARS-CoV-2 public health emergency.  Safety protocols were in place, including screening questions prior to the visit, additional usage of staff PPE, and extensive cleaning of exam room while observing appropriate contact time as indicated for disinfecting solutions.    HPI-  Neil Gilmore presents for f/up -   He complains of a 1 month history of nonproductive and improving cough.  He is no longer taking methotrexate but is controlling his RA symptoms with Humira.  Outpatient Medications Prior to Visit  Medication Sig Dispense Refill   acetaminophen (TYLENOL) 325 MG tablet Take 650 mg by mouth every 6 (six) hours as needed.     Adalimumab (HUMIRA) 40 MG/0.8ML PSKT Inject into the skin.     ibuprofen (ADVIL) 200 MG tablet Take 200 mg by mouth every 6 (six) hours as needed.     predniSONE (DELTASONE) 10 MG tablet Take 10 mg by mouth as needed.     No facility-administered medications prior to visit.    ROS Review of Systems  Constitutional:  Negative for chills, diaphoresis, fatigue and fever.  HENT: Negative.  Negative for sore throat.   Respiratory:  Positive for cough. Negative for chest tightness, shortness of breath and wheezing.   Cardiovascular:  Negative for chest pain, palpitations and leg swelling.  Gastrointestinal:  Negative for abdominal pain, diarrhea, nausea and vomiting.  Genitourinary:  Negative for difficulty urinating.  Musculoskeletal: Negative.   Skin: Negative.  Negative for rash.  Neurological: Negative.  Negative for dizziness and weakness.  Hematological:  Negative for adenopathy. Does not bruise/bleed easily.  Psychiatric/Behavioral: Negative.     Objective:  BP 116/80 (BP Location: Right Arm, Patient Position: Sitting, Cuff Size: Large)    Pulse 78    Temp 97.9 F (36.6 C) (Oral)    Resp 16    Ht 5\' 10"  (1.778 m)     Wt 215 lb (97.5 kg)    SpO2 94%    BMI 30.85 kg/m   BP Readings from Last 3 Encounters:  06/06/21 116/80  10/26/20 128/88  09/21/20 (!) 150/86    Wt Readings from Last 3 Encounters:  06/06/21 215 lb (97.5 kg)  10/26/20 215 lb 12.8 oz (97.9 kg)  09/21/20 216 lb 3.2 oz (98.1 kg)    Physical Exam Vitals reviewed.  Constitutional:      Appearance: Normal appearance. He is not ill-appearing.  HENT:     Nose: Nose normal.     Mouth/Throat:     Mouth: Mucous membranes are moist.  Eyes:     General: No scleral icterus.    Conjunctiva/sclera: Conjunctivae normal.  Cardiovascular:     Rate and Rhythm: Normal rate and regular rhythm.     Heart sounds: No murmur heard. Pulmonary:     Effort: Pulmonary effort is normal.     Breath sounds: No stridor. No wheezing, rhonchi or rales.  Abdominal:     General: Abdomen is flat.     Palpations: There is no mass.     Tenderness: There is no abdominal tenderness. There is no guarding or rebound.     Hernia: No hernia is present.  Musculoskeletal:        General: Normal range of motion.     Cervical back: Neck supple.     Right lower leg: No edema.     Left lower leg:  No edema.  Lymphadenopathy:     Cervical: No cervical adenopathy.  Skin:    General: Skin is warm and dry.     Coloration: Skin is not pale.  Neurological:     General: No focal deficit present.     Mental Status: He is alert.  Psychiatric:        Mood and Affect: Mood normal.        Behavior: Behavior normal.    Lab Results  Component Value Date   WBC 3.9 06/22/2020   HGB 16.2 06/22/2020   HCT 46 06/22/2020   PLT 232 06/22/2020   CHOL 250 (H) 09/14/2020   TRIG 170.0 (H) 09/14/2020   HDL 31.40 (L) 09/14/2020   LDLCALC 184 (H) 09/14/2020   ALT 34 06/22/2020   AST 22 06/22/2020   NA 138 06/22/2020   K 4.5 06/22/2020   CL 103 06/22/2020   CREATININE 1.0 06/22/2020   BUN 13 06/22/2020   CO2 30 (A) 06/22/2020   TSH 1.46 09/14/2020    MR CERVICAL SPINE  WO CONTRAST  Result Date: 09/03/2020 CLINICAL DATA:  Initial evaluation for bilateral hand numbness and weakness, pain from right onto shoulder. EXAM: MRI CERVICAL SPINE WITHOUT CONTRAST TECHNIQUE: Multiplanar, multisequence MR imaging of the cervical spine was performed. No intravenous contrast was administered. COMPARISON:  None. FINDINGS: Alignment: Vertebral bodies normally aligned with preservation of the normal cervical lordosis. No listhesis. Vertebrae: Vertebral body height maintained without acute or chronic fracture. Bone marrow signal intensity within normal limits. 1 cm benign hemangioma noted within the C5 vertebral body. No other discrete or worrisome osseous lesions. No abnormal marrow edema. Cord: Normal signal and morphology. Posterior Fossa, vertebral arteries, paraspinal tissues: Visualized brain and posterior fossa within normal limits. Craniocervical junction normal. Paraspinous and prevertebral soft tissues within normal limits. Normal intravascular flow voids seen within the vertebral arteries bilaterally. Disc levels: C2-C3: Unremarkable. C3-C4:  Unremarkable. C4-C5: Minimal disc bulge mildly indents the ventral thecal sac, slightly asymmetric to the right. No spinal stenosis or significant cord deformity. Foramina remain patent. C5-C6: Diffuse disc bulge with bilateral uncovertebral hypertrophy. Flattening and partial effacement of the ventral thecal sac with resultant mild spinal stenosis, but no cord impingement. Mild right C6 foraminal narrowing. No significant left foraminal encroachment. C6-C7: Degenerative intervertebral disc space narrowing. Broad-based left paracentral disc osteophyte complex indents the left ventral thecal sac (series 7, image 26). Associated annular fissure. Mild spinal stenosis with mild flattening of the left ventral cord, but no cord signal changes. Moderate left worse than right C7 foraminal narrowing. C7-T1: Small central disc protrusion indents the ventral  thecal sac, eccentric to the right (series 6, image 27). No spinal stenosis or cord deformity. Mild facet hypertrophy. No spinal stenosis. T1-2: Small left paracentral disc protrusion indents the ventral thecal sac (series 7, image 34), partially visualized. No significant spinal stenosis. Foramina remain patent. IMPRESSION: 1. Broad-based left paracentral disc osteophyte complex at C6-7 with resultant mild spinal stenosis, with moderate left worse than right C7 foraminal narrowing. 2. Disc bulge with uncovertebral hypertrophy at C5-6 with resultant mild canal and right C6 foraminal stenosis. 3. Small central and left paracentral disc protrusions at C7-T1 and T1-2 without significant stenosis. Electronically Signed   By: Rise MuBenjamin  McClintock M.D.   On: 09/03/2020 18:52   DG Chest 2 View  Result Date: 06/07/2021 CLINICAL DATA:  Nonproductive cough for 4 weeks. Recent Covid-19 infection. EXAM: CHEST - 2 VIEW COMPARISON:  07/07/2015 FINDINGS: The heart size and mediastinal contours  are within normal limits. Both lungs are clear. The visualized skeletal structures are unremarkable. IMPRESSION: No active cardiopulmonary disease. Electronically Signed   By: Danae Orleans M.D.   On: 06/07/2021 14:33     Assessment & Plan:   Emery was seen today for cough.  Diagnoses and all orders for this visit:  Subacute cough- His cough is improving and he does not have any alarming symptoms.  Exam and his chest x-ray are normal.  This is likely a resolving URI.  He will let me know if he has any new or worsening symptoms. -     DG Chest 2 View; Future  Need for vaccination -     Pneumococcal conjugate vaccine 20-valent (Prevnar 20)   I am having Tomio Markgraf "Tim" maintain his acetaminophen, ibuprofen, predniSONE, and Humira.  No orders of the defined types were placed in this encounter.    Follow-up: Return if symptoms worsen or fail to improve.  Sanda Linger, MD

## 2022-08-15 ENCOUNTER — Ambulatory Visit: Payer: 59 | Admitting: Nurse Practitioner

## 2022-08-15 VITALS — BP 124/82 | HR 68 | Temp 97.6°F | Ht 70.0 in | Wt 218.4 lb

## 2022-08-15 DIAGNOSIS — L247 Irritant contact dermatitis due to plants, except food: Secondary | ICD-10-CM | POA: Diagnosis not present

## 2022-08-15 DIAGNOSIS — Z1211 Encounter for screening for malignant neoplasm of colon: Secondary | ICD-10-CM | POA: Diagnosis not present

## 2022-08-15 LAB — COMPREHENSIVE METABOLIC PANEL
ALT: 33 U/L (ref 0–53)
AST: 19 U/L (ref 0–37)
Albumin: 4.6 g/dL (ref 3.5–5.2)
Alkaline Phosphatase: 58 U/L (ref 39–117)
BUN: 13 mg/dL (ref 6–23)
CO2: 29 mEq/L (ref 19–32)
Calcium: 9.8 mg/dL (ref 8.4–10.5)
Chloride: 101 mEq/L (ref 96–112)
Creatinine, Ser: 1.03 mg/dL (ref 0.40–1.50)
GFR: 87.41 mL/min (ref >60.00)
Glucose, Bld: 93 mg/dL (ref 70–99)
Potassium: 4.2 mEq/L (ref 3.5–5.1)
Sodium: 138 mEq/L (ref 135–145)
Total Bilirubin: 0.7 mg/dL (ref 0.2–1.2)
Total Protein: 7.6 g/dL (ref 6.0–8.3)

## 2022-08-15 MED ORDER — PREDNISONE 10 MG (21) PO TBPK: ORAL_TABLET | ORAL | 0 refills | Status: DC

## 2022-08-15 NOTE — Patient Instructions (Addendum)
[place]omeboro - over the counter astringent that you can apply to the skin per package instructions to treat the poison ivy lesions  For pain management ONLY take tylenol while on prednisone. Call if you have any signs of bleeding in stool or severe abdominal pain.

## 2022-08-15 NOTE — Assessment & Plan Note (Signed)
Referral to GI made today.  

## 2022-08-15 NOTE — Assessment & Plan Note (Addendum)
Acute, consistent with contact dermatitis related to exposure to either poison ivy or poison oak.  Patient continues in calamine lotion as needed or [place] astringent that can be found over-the-counter, will start patient on 12-day steroid dose taper.  Will get updated CMP today as well.  Patient encouraged to use cool compress to help with itching relief, pending CMP results if itching continues to be bothersome may consider hydroxyzine.  Patient educated on possible side effects of prednisone, and to avoid NSAIDs for pain management while taking prednisone.  Patient to follow-up if symptoms worsen or do not improve.

## 2022-08-15 NOTE — Progress Notes (Signed)
Established Patient Office Visit  Subjective   Patient ID: Neil Gilmore, male    DOB: 09-Apr-1976  Age: 47 y.o. MRN: [id number]  Chief Complaint  Patient presents with   Poison Ivy    Either poison Ivy or poison oak, been going for two weeks, from the finger tip to the chest area. Itchiness to it, and during the night there are some drainage to it     Patient has for acute visit for the above.  Symptom onset 2 weeks ago after working out in the yard.  He did see either poison oak or poison ivy under the yard debris that he was working with.  Rash erupted on arms and has been spreading over the last 2 weeks down bilateral arms onto hands and now is also spreading on the chest and abdomen.  Rash is itchy, some areas have weaved.  Has been using over-the-counter calamine lotion.  Patient also states he gets physicals through his employer frequently during the year, however has not yet been referred for colon cancer screening and would like to discuss this today.    ROS: see HPI    Objective:     BP 124/82   Pulse 68   Temp 97.6 F (36.4 C) (Oral)   Ht 5\' 10"  (1.778 m)   Wt 218 lb 6 oz (99.1 kg)   SpO2 95%   BMI 31.33 kg/m    Physical Exam Vitals reviewed.  Constitutional:      Appearance: Normal appearance.  HENT:     Head: Normocephalic and atraumatic.  Cardiovascular:     Rate and Rhythm: Normal rate and regular rhythm.  Pulmonary:     Effort: Pulmonary effort is normal.     Breath sounds: Normal breath sounds.  Musculoskeletal:     Cervical back: Neck supple.  Skin:    General: Skin is warm and dry.          Comments: Rash located in areas as depicted by red line on diagram, rash is multiple vesicles on a erythematous base.  Neurological:     Mental Status: He is alert and oriented to person, place, and time.  Psychiatric:        Mood and Affect: Mood normal.        Behavior: Behavior normal.        Thought Content: Thought content normal.        Judgment:  Judgment normal.      No results found for any visits on 08/15/22.    The 10-year ASCVD risk score (Arnett DK, et al., 2019) is: 5.4%    Assessment & Plan:   Problem List Items Addressed This Visit       Musculoskeletal and Integument   Irritant contact dermatitis due to plants, except food - Primary    Acute, consistent with contact dermatitis related to exposure to either poison ivy or poison oak.  Patient continues in calamine lotion as needed or [place] astringent that can be found over-the-counter, will start patient on 12-day steroid dose taper.  Will get updated CMP today as well.  Patient encouraged to use cool compress to help with itching relief, pending CMP results if itching continues to be bothersome may consider hydroxyzine.  Patient educated on possible side effects of prednisone, and to avoid NSAIDs for pain management while taking prednisone.  Patient to follow-up if symptoms worsen or do not improve.      Relevant Medications   predniSONE (STERAPRED UNI-PAK 21 TAB) 10  MG (21) TBPK tablet   Other Relevant Orders   Comprehensive metabolic panel     Other   Colon cancer screening    Referral to GI made today.      Relevant Orders   Ambulatory referral to Gastroenterology    Return if symptoms worsen or fail to improve.    Elenore Paddy, NP

## 2022-09-04 ENCOUNTER — Encounter: Payer: Self-pay | Admitting: Internal Medicine

## 2022-09-12 ENCOUNTER — Ambulatory Visit (AMBULATORY_SURGERY_CENTER): Payer: 59 | Admitting: *Deleted

## 2022-09-12 VITALS — Ht 70.0 in | Wt 205.0 lb

## 2022-09-12 DIAGNOSIS — Z1211 Encounter for screening for malignant neoplasm of colon: Secondary | ICD-10-CM

## 2022-09-12 MED ORDER — NA SULFATE-K SULFATE-MG SULF 17.5-3.13-1.6 GM/177ML PO SOLN: 1.0000 | Freq: Once | ORAL | 0 refills | Status: AC

## 2022-09-12 NOTE — Progress Notes (Signed)
,  Pt's name and DOB verified at the beginning of the pre-visit.  Pt denies any difficulty with ambulating,sitting, laying down or rolling side to side Gave both [facility] main # and MD on call # prior to instructions.  No egg or soy allergy known to patient  No issues known to pt with past sedation with any surgeries or procedures Pt denies having issues being intubated Pt has no issues moving head neck or swallowing No FH of Malignant Hyperthermia Pt is not on diet pills Pt is not on home 02  Pt is not on blood thinners  Pt denies issues with constipation  Pt is not on dialysis Pt denise any abnormal heart rhythms  Pt denies any upcoming cardiac testing Pt encouraged to use to use Singlecare or Goodrx to reduce cost  Patient's chart reviewed by Neil Gilmore CNRA prior to pre-visit and patient appropriate for the [facility].  Pre-visit completed and red dot placed by patient's name on their procedure day (on provider's schedule).  . Visit by phone Pt states weight is 205 lb Instructed pt why it is important to and  to call if they have any changes in health or new medications. Directed them to the # given and on instructions.   Pt states they will.  Instructions reviewed with pt and pt states understanding. Instructed to review again prior to procedure. Pt states they will.  Instructions sent by mail with coupon and by my chart

## 2022-09-23 ENCOUNTER — Encounter: Payer: 59 | Admitting: Internal Medicine

## 2022-10-08 ENCOUNTER — Encounter: Payer: Self-pay | Admitting: Internal Medicine

## 2022-10-17 ENCOUNTER — Encounter: Payer: 59 | Admitting: Internal Medicine

## 2022-10-20 ENCOUNTER — Encounter: Payer: Self-pay | Admitting: Certified Registered Nurse Anesthetist

## 2022-10-23 ENCOUNTER — Encounter: Payer: Self-pay | Admitting: Internal Medicine

## 2022-10-23 ENCOUNTER — Ambulatory Visit (AMBULATORY_SURGERY_CENTER): Payer: 59 | Admitting: Internal Medicine

## 2022-10-23 VITALS — BP 128/81 | HR 83 | Temp 98.0°F | Resp 15 | Ht 70.0 in | Wt 205.0 lb

## 2022-10-23 DIAGNOSIS — Z1211 Encounter for screening for malignant neoplasm of colon: Secondary | ICD-10-CM | POA: Diagnosis present

## 2022-10-23 MED ORDER — SODIUM CHLORIDE 0.9 % IV SOLN: 500.0000 mL | Freq: Once | INTRAVENOUS | Status: DC

## 2022-10-23 NOTE — Op Note (Signed)
[facility] Patient Name: Neil Gilmore Procedure Date: 10/23/2022 3:46 PM MRN: [id number] Endoscopist: Iva Boop , MD, [id number] Age: 47 Referring MD:  Date of Birth: [birth year] Gender: Male Account #: [id number] Procedure:                Colonoscopy Indications:              Screening for colorectal malignant neoplasm Medicines:                Monitored Anesthesia Care Procedure:                Pre-Anesthesia Assessment:                           - Prior to the procedure, a History and Physical                            was performed, and patient medications and                            allergies were reviewed. The patient's tolerance of                            previous anesthesia was also reviewed. The risks                            and benefits of the procedure and the sedation                            options and risks were discussed with the patient.                            All questions were answered, and informed consent                            was obtained. Prior Anticoagulants: The patient has                            taken no anticoagulant or antiplatelet agents. ASA                            Grade Assessment: II - A patient with mild systemic                            disease. After reviewing the risks and benefits,                            the patient was deemed in satisfactory condition to                            undergo the procedure.                           After obtaining informed consent, the colonoscope  was passed under direct vision. Throughout the                            procedure, the patient's blood pressure, pulse, and                            oxygen saturations were monitored continuously. The                            CF [id number] #[id number] was introduced through the anus                            and advanced to the the cecum, identified by                            appendiceal  orifice and ileocecal valve. The                            colonoscopy was performed without difficulty. The                            patient tolerated the procedure well. The quality                            of the bowel preparation was good. The ileocecal                            valve, appendiceal orifice, and rectum were                            photographed. The bowel preparation used was SUPREP                            via split dose instruction. Scope In: 4:01:44 PM Scope Out: 4:17:24 PM Scope Withdrawal Time: 0 hours 9 minutes 32 seconds  Total Procedure Duration: 0 hours 15 minutes 40 seconds  Findings:                 The perianal and digital rectal examinations were                            normal. Pertinent negatives include normal prostate                            (size, shape, and consistency).                           A few diverticula were found in the sigmoid colon.                           The exam was otherwise without abnormality on                            direct and retroflexion views. Complications:  No immediate complications. Estimated Blood Loss:     Estimated blood loss: none. Impression:               - Diverticulosis in the sigmoid colon.                           - The examination was otherwise normal on direct                            and retroflexion views.                           - No specimens collected. Recommendation:           - Patient has a contact number available for                            emergencies. The signs and symptoms of potential                            delayed complications were discussed with the                            patient. Return to normal activities tomorrow.                            Written discharge instructions were provided to the                            patient.                           - Resume previous diet.                           - Continue present medications.                            - Repeat colonoscopy or other appropriate test in                            10 years for screening purposes. Iva Boop, MD 10/23/2022 4:21:48 PM This report has been signed electronically.

## 2022-10-23 NOTE — Progress Notes (Signed)
[facility] History and Physical   Primary Care Physician:  Etta Grandchild, MD   Reason for Procedure:   CRCA screening  Plan:    colonoscopy     HPI: Neil Gilmore is a 47 y.o. male for screening exam   Past Medical History:  Diagnosis Date   Arthritis    Asthma    Frequent headaches    GERD (gastroesophageal reflux disease)     Past Surgical History:  Procedure Laterality Date   BACK SURGERY     L4,5,6    HERNIA REPAIR     WISDOM TOOTH EXTRACTION      Prior to Admission medications   Medication Sig Start Date End Date Taking? Authorizing Provider  Adalimumab (HUMIRA) 40 MG/0.8ML PSKT Inject into the skin.   Yes [provider]  acetaminophen (TYLENOL) 325 MG tablet Take 650 mg by mouth every 6 (six) hours as needed. Patient not taking: Reported on 09/12/2022    [provider]  ibuprofen (ADVIL) 200 MG tablet Take 200 mg by mouth every 6 (six) hours as needed. Patient not taking: Reported on 09/12/2022    [provider]  predniSONE (STERAPRED UNI-PAK 21 TAB) 10 MG (21) TBPK tablet Take 6 tablets by mouth daily x 2 days, take 5 tablets by mouth daily x 2 days, take 4 tablets by mouth daily x 2 days, take 3 tablets by mouth daily x 2 days, take 2 tablets by mouth daily x 2 days, take 1 tablet by mouth daily x 2 days then stop. Patient not taking: Reported on 10/23/2022 08/15/22   Elenore Paddy, NP    Current Outpatient Medications  Medication Sig Dispense Refill   Adalimumab (HUMIRA) 40 MG/0.8ML PSKT Inject into the skin.     acetaminophen (TYLENOL) 325 MG tablet Take 650 mg by mouth every 6 (six) hours as needed. (Patient not taking: Reported on 09/12/2022)     ibuprofen (ADVIL) 200 MG tablet Take 200 mg by mouth every 6 (six) hours as needed. (Patient not taking: Reported on 09/12/2022)     predniSONE (STERAPRED UNI-PAK 21 TAB) 10 MG (21) TBPK tablet Take 6 tablets by mouth daily x 2 days, take 5 tablets by mouth daily x 2 days, take  4 tablets by mouth daily x 2 days, take 3 tablets by mouth daily x 2 days, take 2 tablets by mouth daily x 2 days, take 1 tablet by mouth daily x 2 days then stop. (Patient not taking: Reported on 10/23/2022) 42 tablet 0   Current Facility-Administered Medications  Medication Dose Route Frequency Provider Last Rate Last Admin   0.9 %  sodium chloride infusion  500 mL Intravenous Once Iva Boop, MD        Allergies as of 10/23/2022 - Review Complete 10/23/2022  Allergen Reaction Noted   Penicillins Hives, Itching, Other (See Comments), Shortness Of Breath, and Swelling 02/26/2012   Tetracyclines & related  02/26/2012    Family History  Problem Relation Age of Onset   Arthritis Mother    Hypertension Mother    Aneurysm Father    Hypertension Brother    Heart attack Maternal Grandfather    Colon cancer Neg Hx    Colon polyps Neg Hx    Esophageal cancer Neg Hx    Stomach cancer Neg Hx    Rectal cancer Neg Hx     Social History   Socioeconomic History   Marital status: Married    Spouse name: Not on file  Number of children: Not on file   Years of education: Not on file   Highest education level: Not on file  Occupational History   Not on file  Tobacco Use   Smoking status: Never   Smokeless tobacco: Never  Vaping Use   Vaping Use: Never used  Substance and Sexual Activity   Alcohol use: Yes    Alcohol/week: 1.0 standard drink of alcohol    Types: 1 Shots of liquor per week   Drug use: Never   Sexual activity: Yes    Partners: Female  Other Topics Concern   Not on file  Social History Narrative   Not on file   Social Determinants of Health   Financial Resource Strain: Not on file  Food Insecurity: Not on file  Transportation Needs: Not on file  Physical Activity: Not on file  Stress: Not on file  Social Connections: Not on file  Intimate Partner Violence: Not on file    Review of Systems:  All other review of systems negative except as mentioned in  the HPI.  Physical Exam: Vital signs BP 121/83   Pulse 89   Temp 98 F (36.7 C) (Skin)   Ht 5\' 10"  (1.778 m)   Wt 205 lb (93 kg)   SpO2 98%   BMI 29.41 kg/m   General:   Alert,  Well-developed, well-nourished, pleasant and cooperative in NAD Lungs:  Clear throughout to auscultation.   Heart:  Regular rate and rhythm; no murmurs, clicks, rubs,  or gallops. Abdomen:  Soft, nontender and nondistended. Normal bowel sounds.   Neuro/Psych:  Alert and cooperative. Normal mood and affect. A and O x 3   @Journey Ratterman  Sena Slate, MD, [facility] [phone] (pager) 10/23/2022 3:54 PM@

## 2022-10-23 NOTE — Patient Instructions (Addendum)
No polyps or cancer were seen.  You do have a condition called diverticulosis - common and not usually a problem. Please read the handout provided.  Next routine colonoscopy or other screening test in 10 years - 2034.  I appreciate the opportunity to care for you. Iva Boop, MD, FACG   YOU HAD AN ENDOSCOPIC PROCEDURE TODAY AT THE [facility]:   Refer to the procedure report that was given to you for any specific questions about what was found during the examination.  If the procedure report does not answer your questions, please call your gastroenterologist to clarify.  If you requested that your care partner not be given the details of your procedure findings, then the procedure report has been included in a sealed envelope for you to review at your convenience later.  YOU SHOULD EXPECT: Some feelings of bloating in the abdomen. Passage of more gas than usual.  Walking can help get rid of the air that was put into your GI tract during the procedure and reduce the bloating. If you had a lower endoscopy (such as a colonoscopy or flexible sigmoidoscopy) you may notice spotting of blood in your stool or on the toilet paper. If you underwent a bowel prep for your procedure, you may not have a normal bowel movement for a few days.  Please Note:  You might notice some irritation and congestion in your nose or some drainage.  This is from the oxygen used during your procedure.  There is no need for concern and it should clear up in a day or so.  SYMPTOMS TO REPORT IMMEDIATELY:  Following lower endoscopy (colonoscopy or flexible sigmoidoscopy):  Excessive amounts of blood in the stool  Significant tenderness or worsening of abdominal pains  Swelling of the abdomen that is new, acute  Fever of 100F or higher  Following upper endoscopy (EGD)  Vomiting of blood or coffee ground material  New chest pain or pain under the shoulder blades  Painful or persistently difficult  swallowing  New shortness of breath  Fever of 100F or higher  Black, tarry-looking stools  For urgent or emergent issues, a gastroenterologist can be reached at any hour by calling [phone]. Do not use MyChart messaging for urgent concerns.    DIET:  We do recommend a small meal at first, but then you may proceed to your regular diet.  Drink plenty of fluids but you should avoid alcoholic beverages for 24 hours.  ACTIVITY:  You should plan to take it easy for the rest of today and you should NOT DRIVE or use heavy machinery until tomorrow (because of the sedation medicines used during the test).    FOLLOW UP: Our staff will call the number listed on your records the next business day following your procedure.  We will call around 7:15- 8:00 am to check on you and address any questions or concerns that you may have regarding the information given to you following your procedure. If we do not reach you, we will leave a message.     If any biopsies were taken you will be contacted by phone or by letter within the next 1-3 weeks.  Please call us at [phone] if you have not heard about the biopsies in 3 weeks.    SIGNATURES/CONFIDENTIALITY: You and/or your care partner have signed paperwork which will be entered into your electronic medical record.  These signatures attest to the fact that that the information above on your After  Visit Summary has been reviewed and is understood.  Full responsibility of the confidentiality of this discharge information lies with you and/or your care-partner. 

## 2022-10-23 NOTE — Progress Notes (Signed)
Pt's states no medical or surgical changes since previsit or office visit. VS assessed by J.D °

## 2022-10-24 ENCOUNTER — Telehealth: Payer: Self-pay

## 2022-10-24 NOTE — Telephone Encounter (Signed)
Follow up call to pt, lm for pt to call if having any difficulty with normal activities or eating and drinking.  Also to call if any other questions or concerns.  

## 2023-06-04 LAB — LIPID PANEL
Cholesterol: 270 — AB (ref 0–200)
EGFR (Non-African Amer.): 89
HDL: 33 — AB (ref 35–70)
LDL Cholesterol: 193
PSA, Total: 0.5
Triglycerides: 248 — AB (ref 40–160)

## 2023-06-04 LAB — CBC AND DIFFERENTIAL
HCT: 48 (ref 41–53)
Hemoglobin: 15.9 (ref 13.5–17.5)
Platelets: 251 10*3/uL (ref 150–400)
WBC: 8.7

## 2023-06-04 LAB — BASIC METABOLIC PANEL WITH GFR
BUN: 13 (ref 4–21)
CO2: 28 — AB (ref 13–22)
Chloride: 103 (ref 99–108)
Creatinine: 1.1 (ref 0.6–1.3)
Glucose: 91
Potassium: 4.8 meq/L (ref 3.5–5.1)
Sodium: 139 (ref 137–147)

## 2023-06-04 LAB — PSA: PSA: 0.5

## 2023-06-04 LAB — HEPATIC FUNCTION PANEL
ALT: 33 U/L (ref 10–40)
AST: 21 (ref 14–40)
Alkaline Phosphatase: 62 (ref 25–125)
Bilirubin, Total: 0.8

## 2023-06-04 LAB — COMPREHENSIVE METABOLIC PANEL WITH GFR
Albumin: 4.8 (ref 3.5–5.0)
Calcium: 10.1 (ref 8.7–10.7)

## 2023-06-05 ENCOUNTER — Ambulatory Visit: Payer: 59 | Admitting: Nurse Practitioner

## 2023-06-05 VITALS — BP 114/82 | HR 83 | Temp 98.2°F | Ht 70.0 in | Wt 221.4 lb

## 2023-06-05 DIAGNOSIS — K219 Gastro-esophageal reflux disease without esophagitis: Secondary | ICD-10-CM | POA: Diagnosis not present

## 2023-06-05 MED ORDER — PANTOPRAZOLE SODIUM 40 MG PO TBEC
40.0000 mg | DELAYED_RELEASE_TABLET | Freq: Every day | ORAL | 0 refills | Status: DC
Start: 2023-06-05 — End: 2023-08-07

## 2023-06-05 NOTE — Assessment & Plan Note (Addendum)
Acute Seems consistent with GERD, low suspicion for CAD causing symptoms based on history. He will start pantoprazole 40 mg daily x 6 weeks, then 40 mg every other day x 2 weeks, then try to stop. He will then follow-up with me or his PCP, if symptoms persist I would recommend referral to GI. He was encouraged to reach out if he notices any bloody stool, black tarry stool, or chest pain. He was told he can take Tums and/or Pepcid as needed for breakthrough reflux as well. We also discussed avoiding common trigger foods such as caffeine, spicy foods, chocolate, mint.  We also discussed trying to lose maybe 5 pounds or so between now and next appointment as this may also help with his symptoms.

## 2023-06-05 NOTE — Progress Notes (Signed)
Established Patient Office Visit  Subjective   Patient ID: Neil Gilmore, male    DOB: January 21, 1976  Age: 48 y.o. MRN: [id number]  Chief Complaint  Patient presents with   Gastroesophageal Reflux    Patient has today for the above.  He reports that over the last 2 to 4 months he has been experiencing increased heartburn symptoms especially at night.  He has been using Tums fairly regularly to manage his symptoms and arrives today to discuss further.  He has not identified any specific food triggers.  He experiences heartburn, sometimes coughing, and gagging when laying down at night.  He reports sitting upright for at least 2 to 3 hours after dinner before going to bed at night.  Has not noticed much improvement with use of Tums or milk.  Denies any new fatigue, shortness of breath, wheezing, chest pain, bright red blood per rectum, or black tarry stools.  Per chart review he has gained about 15 to 16 pounds since his last office visit.  He is With rheumatology for treatment of rheumatoid arthritis, and reports that he is no longer taking prednisone but he was taking this pretty regularly for some time in the recent past.  Labs completed by rheumatologist on 05/13/23: CBC: White blood cell 10.3 Red blood cell 5.50 Hemoglobin 16.1 Hematocrit 49.1 Platelet 240  Metabolic panel: Glucose 141 BUN 13 Creatinine 1.08 eGFR 86 Sodium 142 Potassium 4.1 Chloride 103 CO2 24 Calcium 9.6 Total protein 7.9 Albumin 4.8 Alkaline phosphatase 78 AST 23 ALT 35    ROS: see HPI    Objective:     BP 114/82   Pulse 83   Temp 98.2 F (36.8 C) (Temporal)   Ht 5\' 10"  (1.778 m)   Wt 221 lb 6 oz (100.4 kg)   SpO2 95%   BMI 31.76 kg/m  BP Readings from Last 3 Encounters:  06/05/23 114/82  10/23/22 128/81  08/15/22 124/82   Wt Readings from Last 3 Encounters:  06/05/23 221 lb 6 oz (100.4 kg)  10/23/22 205 lb (93 kg)  09/12/22 205 lb (93 kg)      Physical Exam Vitals reviewed.   Constitutional:      Appearance: Normal appearance.  HENT:     Head: Normocephalic and atraumatic.  Cardiovascular:     Rate and Rhythm: Normal rate and regular rhythm.  Pulmonary:     Effort: Pulmonary effort is normal.     Breath sounds: Normal breath sounds.  Musculoskeletal:     Cervical back: Neck supple.  Skin:    General: Skin is warm and dry.  Neurological:     Mental Status: He is alert and oriented to person, place, and time.  Psychiatric:        Mood and Affect: Mood normal.        Behavior: Behavior normal.        Thought Content: Thought content normal.        Judgment: Judgment normal.      No results found for any visits on 06/05/23.    The 10-year ASCVD risk score (Arnett DK, et al., 2019) is: 4.7%    Assessment & Plan:   Problem List Items Addressed This Visit       Digestive   Gastroesophageal reflux disease - Primary   Acute Seems consistent with GERD, low suspicion for CAD causing symptoms based on history. He will start pantoprazole 40 mg daily x 6 weeks, then 40 mg every other day x 2 weeks,  then try to stop. He will then follow-up with me or his PCP, if symptoms persist I would recommend referral to GI. He was encouraged to reach out if he notices any bloody stool, black tarry stool, or chest pain. He was told he can take Tums and/or Pepcid as needed for breakthrough reflux as well.      Relevant Medications   pantoprazole (PROTONIX) 40 MG tablet    Return in about 9 weeks (around 08/07/2023) for F/U with Maralyn Sago or PCP.  Total time spent on encounter today was 31 minutes including face-to-face evaluation, review previous medical records, and development/discussion of treatment plan.   Elenore Paddy, NP

## 2023-08-07 ENCOUNTER — Other Ambulatory Visit (HOSPITAL_COMMUNITY): Payer: Self-pay

## 2023-08-07 ENCOUNTER — Ambulatory Visit: Payer: 59 | Admitting: Internal Medicine

## 2023-08-07 ENCOUNTER — Encounter: Payer: Self-pay | Admitting: Internal Medicine

## 2023-08-07 VITALS — BP 112/68 | HR 92 | Temp 98.3°F | Resp 16 | Ht 70.0 in | Wt 220.2 lb

## 2023-08-07 DIAGNOSIS — Z0000 Encounter for general adult medical examination without abnormal findings: Secondary | ICD-10-CM | POA: Diagnosis not present

## 2023-08-07 DIAGNOSIS — E785 Hyperlipidemia, unspecified: Secondary | ICD-10-CM | POA: Diagnosis not present

## 2023-08-07 DIAGNOSIS — E7849 Other hyperlipidemia: Secondary | ICD-10-CM

## 2023-08-07 DIAGNOSIS — K219 Gastro-esophageal reflux disease without esophagitis: Secondary | ICD-10-CM | POA: Diagnosis not present

## 2023-08-07 LAB — TSH: TSH: 1.24 u[IU]/mL (ref 0.35–5.50)

## 2023-08-07 MED ORDER — VOQUEZNA 10 MG PO TABS
1.0000 | ORAL_TABLET | Freq: Every day | ORAL | 1 refills | Status: AC
Start: 2023-08-07 — End: ?

## 2023-08-07 MED ORDER — ROSUVASTATIN CALCIUM 20 MG PO TABS
20.0000 mg | ORAL_TABLET | Freq: Every day | ORAL | 1 refills | Status: AC
Start: 2023-08-07 — End: ?

## 2023-08-07 MED ORDER — VOQUEZNA 10 MG PO TABS
1.0000 | ORAL_TABLET | Freq: Every day | ORAL | Status: AC
Start: 2023-08-07 — End: ?

## 2023-08-07 NOTE — Patient Instructions (Signed)
 Health Maintenance, Male  Adopting a healthy lifestyle and getting preventive care are important in promoting health and wellness. Ask your health care provider about:  The right schedule for you to have regular tests and exams.  Things you can do on your own to prevent diseases and keep yourself healthy.  What should I know about diet, weight, and exercise?  Eat a healthy diet    Eat a diet that includes plenty of vegetables, fruits, low-fat dairy products, and lean protein.  Do not eat a lot of foods that are high in solid fats, added sugars, or sodium.  Maintain a healthy weight  Body mass index (BMI) is a measurement that can be used to identify possible weight problems. It estimates body fat based on height and weight. Your health care provider can help determine your BMI and help you achieve or maintain a healthy weight.  Get regular exercise  Get regular exercise. This is one of the most important things you can do for your health. Most adults should:  Exercise for at least 150 minutes each week. The exercise should increase your heart rate and make you sweat (moderate-intensity exercise).  Do strengthening exercises at least twice a week. This is in addition to the moderate-intensity exercise.  Spend less time sitting. Even light physical activity can be beneficial.  Watch cholesterol and blood lipids  Have your blood tested for lipids and cholesterol at 47 years of age, then have this test every 5 years.  You may need to have your cholesterol levels checked more often if:  Your lipid or cholesterol levels are high.  You are older than 47 years of age.  You are at high risk for heart disease.  What should I know about cancer screening?  Many types of cancers can be detected early and may often be prevented. Depending on your health history and family history, you may need to have cancer screening at various ages. This may include screening for:  Colorectal cancer.  Prostate cancer.  Skin cancer.  Lung  cancer.  What should I know about heart disease, diabetes, and high blood pressure?  Blood pressure and heart disease  High blood pressure causes heart disease and increases the risk of stroke. This is more likely to develop in people who have high blood pressure readings or are overweight.  Talk with your health care provider about your target blood pressure readings.  Have your blood pressure checked:  Every 3-5 years if you are 9-95 years of age.  Every year if you are 85 years old or older.  If you are between the ages of 29 and 29 and are a current or former smoker, ask your health care provider if you should have a one-time screening for abdominal aortic aneurysm (AAA).  Diabetes  Have regular diabetes screenings. This checks your fasting blood sugar level. Have the screening done:  Once every three years after age 23 if you are at a normal weight and have a low risk for diabetes.  More often and at a younger age if you are overweight or have a high risk for diabetes.  What should I know about preventing infection?  Hepatitis B  If you have a higher risk for hepatitis B, you should be screened for this virus. Talk with your health care provider to find out if you are at risk for hepatitis B infection.  Hepatitis C  Blood testing is recommended for:  Everyone born from [birth year] through 1964.  Anyone  with known risk factors for hepatitis C.  Sexually transmitted infections (STIs)  You should be screened each year for STIs, including gonorrhea and chlamydia, if:  You are sexually active and are younger than 47 years of age.  You are older than 47 years of age and your health care provider tells you that you are at risk for this type of infection.  Your sexual activity has changed since you were last screened, and you are at increased risk for chlamydia or gonorrhea. Ask your health care provider if you are at risk.  Ask your health care provider about whether you are at high risk for HIV. Your health care provider  may recommend a prescription medicine to help prevent HIV infection. If you choose to take medicine to prevent HIV, you should first get tested for HIV. You should then be tested every 3 months for as long as you are taking the medicine.  Follow these instructions at home:  Alcohol use  Do not drink alcohol if your health care provider tells you not to drink.  If you drink alcohol:  Limit how much you have to 0-2 drinks a day.  Know how much alcohol is in your drink. In the [place] one drink equals one 12 oz bottle of beer (355 mL), one 5 oz glass of wine (148 mL), or one 1 oz glass of hard liquor (44 mL).  Lifestyle  Do not use any products that contain nicotine or tobacco. These products include cigarettes, chewing tobacco, and vaping devices, such as e-cigarettes. If you need help quitting, ask your health care provider.  Do not use street drugs.  Do not share needles.  Ask your health care provider for help if you need support or information about quitting drugs.  General instructions  Schedule regular health, dental, and eye exams.  Stay current with your vaccines.  Tell your health care provider if:  You often feel depressed.  You have ever been abused or do not feel safe at home.  Summary  Adopting a healthy lifestyle and getting preventive care are important in promoting health and wellness.  Follow your health care provider's instructions about healthy diet, exercising, and getting tested or screened for diseases.  Follow your health care provider's instructions on monitoring your cholesterol and blood pressure.  This information is not intended to replace advice given to you by your health care provider. Make sure you discuss any questions you have with your health care provider.  Document Revised: 09/11/2020 Document Reviewed: 09/11/2020  Elsevier Patient Education  2024 [employer].

## 2023-08-07 NOTE — Progress Notes (Signed)
 Subjective:  Patient ID: Neil Gilmore, male    DOB: [birth year]  Age: 48 y.o. MRN: [id number]  CC: Annual Exam, Gastroesophageal Reflux, and Hyperlipidemia   HPI Neil Gilmore presents for a CPX and f/up ---  Discussed the use of AI scribe software for clinical note transcription with the patient, who gave verbal consent to proceed.  History of Present Illness   Neil Gilmore "Neil Gilmore" is a 48 year old male who presents for follow-up on heartburn and elevated LDL cholesterol.   Neil Gilmore has a history of significant heartburn, which led to excessive consumption of Tums. His wife suggested ordering Tums from [place], which made him realize the extent of his usage. Neil Gilmore started on pantoprazole daily, which improved his symptoms significantly by mid-March 2025. Neil Gilmore reduced the frequency to every other day and is now taking it every three days. Neil Gilmore occasionally experiences trouble swallowing. Neil Gilmore stopped using Tums cold [place] after about four days on pantoprazole.  His LDL cholesterol level is elevated at 193 mg/dL, which was unexpectedly high compared to previous tests. Neil Gilmore undergoes blood work four times a year, and this result was notably different. No symptoms of heart disease, such as chest pain, shortness of breath, or cold sweats during exercise. No family history of heart disease in his parents or siblings, but his grandparents had heart issues. His father died of a brain aneurysm, and his brother has a history of blood pressure issues. Neil Gilmore has been using Humira for a year, which Neil Gilmore feels has led to changes in his health.  No chest pain, shortness of breath, cold sweats, weakness, dizziness, or lightheadedness. No pain or swelling of the thyroid gland, testicular pain or swelling, and no signs or symptoms of prostate cancer. His blood pressure is typically well-controlled, and Neil Gilmore reports no issues with his current blood pressure readings.       Outpatient Medications Prior to Visit  Medication Sig  Dispense Refill   acetaminophen (TYLENOL) 325 MG tablet Take 650 mg by mouth every 6 (six) hours as needed.     Adalimumab (HUMIRA) 40 MG/0.8ML PSKT Inject into the skin.     ibuprofen (ADVIL) 200 MG tablet Take 200 mg by mouth every 6 (six) hours as needed.     pantoprazole (PROTONIX) 40 MG tablet Take 1 tablet (40 mg total) by mouth daily. (Patient taking differently: Take 40 mg by mouth. Every three days) 90 tablet 0   No facility-administered medications prior to visit.    ROS Review of Systems  Constitutional:  Positive for unexpected weight change (wt gain). Negative for appetite change, diaphoresis and fatigue.  HENT:  Positive for trouble swallowing. Negative for sinus pressure and sore throat.   Eyes: Negative.  Negative for visual disturbance.  Respiratory:  Negative for cough, chest tightness, shortness of breath and wheezing.   Cardiovascular:  Negative for chest pain, palpitations and leg swelling.  Gastrointestinal:  Negative for abdominal pain, blood in stool, constipation, diarrhea and vomiting.  Endocrine: Negative.   Genitourinary: Negative.  Negative for difficulty urinating.  Musculoskeletal: Negative.   Skin: Negative.   Neurological: Negative.  Negative for dizziness and weakness.  Hematological:  Negative for adenopathy. Does not bruise/bleed easily.  Psychiatric/Behavioral: Negative.      Objective:  BP 112/68 (BP Location: Right Arm, Patient Position: Sitting, Cuff Size: Large)   Pulse 92   Temp 98.3 F (36.8 C) (Oral)   Resp 16   Ht 5\' 10"  (1.778 m)   Wt 220 lb 3.2 oz (  99.9 kg)   SpO2 95%   BMI 31.60 kg/m   BP Readings from Last 3 Encounters:  08/07/23 112/68  06/05/23 114/82  10/23/22 128/81    Wt Readings from Last 3 Encounters:  08/07/23 220 lb 3.2 oz (99.9 kg)  06/05/23 221 lb 6 oz (100.4 kg)  10/23/22 205 lb (93 kg)    Physical Exam Vitals reviewed.  Constitutional:      Appearance: Normal appearance.  HENT:     Nose: Nose normal.      Mouth/Throat:     Mouth: Mucous membranes are moist.  Eyes:     General: No scleral icterus.    Conjunctiva/sclera: Conjunctivae normal.  Cardiovascular:     Rate and Rhythm: Normal rate and regular rhythm.     Heart sounds: No murmur heard.    No friction rub. No gallop.  Pulmonary:     Effort: Pulmonary effort is normal.     Breath sounds: No stridor. No wheezing, rhonchi or rales.  Abdominal:     General: Abdomen is flat. Bowel sounds are normal.     Palpations: There is no mass.     Tenderness: There is no abdominal tenderness. There is no guarding.     Hernia: No hernia is present.  Musculoskeletal:        General: Normal range of motion.     Cervical back: Neck supple.     Right lower leg: No edema.     Left lower leg: No edema.  Lymphadenopathy:     Cervical: No cervical adenopathy.  Skin:    General: Skin is warm and dry.  Neurological:     General: No focal deficit present.     Mental Status: Neil Gilmore is alert. Mental status is at baseline.  Psychiatric:        Mood and Affect: Mood normal.        Behavior: Behavior normal.     Lab Results  Component Value Date   WBC 8.7 06/04/2023   HGB 15.9 06/04/2023   HCT 48 06/04/2023   PLT 251 06/04/2023   GLUCOSE 93 08/15/2022   CHOL 270 (A) 06/04/2023   TRIG 248 (A) 06/04/2023   HDL 33 (A) 06/04/2023   LDLCALC 193 06/04/2023   ALT 33 06/04/2023   AST 21 06/04/2023   NA 139 06/04/2023   K 4.8 06/04/2023   CL 103 06/04/2023   CREATININE 1.1 06/04/2023   BUN 13 06/04/2023   CO2 28 (A) 06/04/2023   TSH 1.24 08/07/2023   PSA 0.50 06/04/2023    MR CERVICAL SPINE WO CONTRAST Result Date: 09/03/2020 CLINICAL DATA:  Initial evaluation for bilateral hand numbness and weakness, pain from right onto shoulder. EXAM: MRI CERVICAL SPINE WITHOUT CONTRAST TECHNIQUE: Multiplanar, multisequence MR imaging of the cervical spine was performed. No intravenous contrast was administered. COMPARISON:  None. FINDINGS: Alignment:  Vertebral bodies normally aligned with preservation of the normal cervical lordosis. No listhesis. Vertebrae: Vertebral body height maintained without acute or chronic fracture. Bone marrow signal intensity within normal limits. 1 cm benign hemangioma noted within the C5 vertebral body. No other discrete or worrisome osseous lesions. No abnormal marrow edema. Cord: Normal signal and morphology. Posterior Fossa, vertebral arteries, paraspinal tissues: Visualized brain and posterior fossa within normal limits. Craniocervical junction normal. Paraspinous and prevertebral soft tissues within normal limits. Normal intravascular flow voids seen within the vertebral arteries bilaterally. Disc levels: C2-C3: Unremarkable. C3-C4:  Unremarkable. C4-C5: Minimal disc bulge mildly indents the ventral thecal sac, slightly  asymmetric to the right. No spinal stenosis or significant cord deformity. Foramina remain patent. C5-C6: Diffuse disc bulge with bilateral uncovertebral hypertrophy. Flattening and partial effacement of the ventral thecal sac with resultant mild spinal stenosis, but no cord impingement. Mild right C6 foraminal narrowing. No significant left foraminal encroachment. C6-C7: Degenerative intervertebral disc space narrowing. Broad-based left paracentral disc osteophyte complex indents the left ventral thecal sac (series 7, image 26). Associated annular fissure. Mild spinal stenosis with mild flattening of the left ventral cord, but no cord signal changes. Moderate left worse than right C7 foraminal narrowing. C7-T1: Small central disc protrusion indents the ventral thecal sac, eccentric to the right (series 6, image 27). No spinal stenosis or cord deformity. Mild facet hypertrophy. No spinal stenosis. T1-2: Small left paracentral disc protrusion indents the ventral thecal sac (series 7, image 34), partially visualized. No significant spinal stenosis. Foramina remain patent. IMPRESSION: 1. Broad-based left  paracentral disc osteophyte complex at C6-7 with resultant mild spinal stenosis, with moderate left worse than right C7 foraminal narrowing. 2. Disc bulge with uncovertebral hypertrophy at C5-6 with resultant mild canal and right C6 foraminal stenosis. 3. Small central and left paracentral disc protrusions at C7-T1 and T1-2 without significant stenosis. Electronically Signed   By: Rise Mu M.D.   On: 09/03/2020 18:52    Assessment & Plan:   Routine general medical examination at a health care facility - Exam completed, labs reviewed, vaccines reviewed, cancer screenings are UTD, pt ed material was given.   Hyperlipidemia with target LDL less than 130- Will start a statin for CV risk reduction. -     TSH; Future -     Lipoprotein A (LPA); Future -     Apolipoprotein B; Future -     Rosuvastatin Calcium; Take 1 tablet (20 mg total) by mouth daily.  Dispense: 90 tablet; Refill: 1  Gastroesophageal reflux disease without esophagitis- Will upgrade to voquezna. -     Voquezna; Take 1 tablet by mouth daily.  Dispense: 90 tablet; Refill: 1 -     Voquezna; Take 1 tablet by mouth daily.  Familial hyperlipidemia, high LDL- Will risk stratify with a coronary calcium score.  -     Rosuvastatin Calcium; Take 1 tablet (20 mg total) by mouth daily.  Dispense: 90 tablet; Refill: 1 -     CT CARDIAC SCORING ([facility] LOCATIONS ONLY); Future     Follow-up: Return in about 6 months (around 02/06/2024).  Sanda Linger, MD

## 2023-08-08 ENCOUNTER — Telehealth: Payer: Self-pay | Admitting: Internal Medicine

## 2023-08-08 ENCOUNTER — Telehealth: Payer: Self-pay | Admitting: Pharmacy Technician

## 2023-08-08 ENCOUNTER — Other Ambulatory Visit (HOSPITAL_COMMUNITY): Payer: Self-pay

## 2023-08-08 LAB — APOLIPOPROTEIN B: Apolipoprotein B: 144 mg/dL — ABNORMAL HIGH (ref <90)

## 2023-08-08 NOTE — Telephone Encounter (Signed)
 Pharmacy Patient Advocate Encounter  Received notification from [facility] that Prior Authorization for Voquezna 10MG  tablets has been APPROVED from 08/08/2023 to 05/09/2024. Ran test claim, Copay is $50.00. This test claim was processed through [facility]- copay amounts may vary at other pharmacies due to pharmacy/plan contracts, or as the patient moves through the different stages of their insurance plan.   PA #/Case ID/Reference #: [id number]

## 2023-08-08 NOTE — Telephone Encounter (Signed)
 Copied from CRM [id number]. Topic: Referral - Question >> Aug 08, 2023 11:05 AM Saverio Danker wrote: Reason for CRM: jane calling from [facility] for a re-fax  for the patients appointment fax #[phone]

## 2023-08-08 NOTE — Telephone Encounter (Signed)
 Pharmacy Patient Advocate Encounter   Received notification from CoverMyMeds that prior authorization for Voquezna 10MG  tablets is required/requested.   Insurance verification completed.   The patient is insured through [facility] .   Per test claim: PA required; PA submitted to above mentioned insurance via CoverMyMeds Key/confirmation #/EOC [id number] Status is pending

## 2023-08-11 ENCOUNTER — Encounter: Payer: Self-pay | Admitting: Internal Medicine

## 2023-08-12 LAB — LIPOPROTEIN A (LPA): Lipoprotein (a): <10 nmol/L (ref <75)

## 2023-08-13 ENCOUNTER — Ambulatory Visit
Admission: RE | Admit: 2023-08-13 | Discharge: 2023-08-13 | Disposition: A | Discharge status: Home / Self Care | Source: Ambulatory Visit | Attending: Internal Medicine | Admitting: Internal Medicine

## 2023-08-13 DIAGNOSIS — E7849 Other hyperlipidemia: Secondary | ICD-10-CM

## 2023-08-14 NOTE — Telephone Encounter (Signed)
 This has been completed. Patient had his imaging done yesterday.

## 2023-08-15 ENCOUNTER — Encounter: Payer: Self-pay | Admitting: Internal Medicine

## 2023-08-15 ENCOUNTER — Other Ambulatory Visit: Payer: Self-pay | Admitting: Internal Medicine

## 2023-08-15 DIAGNOSIS — E7849 Other hyperlipidemia: Secondary | ICD-10-CM

## 2023-08-15 DIAGNOSIS — E785 Hyperlipidemia, unspecified: Secondary | ICD-10-CM

## 2023-08-15 DIAGNOSIS — E7801 Familial hypercholesterolemia: Secondary | ICD-10-CM | POA: Insufficient documentation

## 2023-08-17 ENCOUNTER — Encounter: Payer: Self-pay | Admitting: Internal Medicine

## 2024-05-17 ENCOUNTER — Encounter

## 2024-05-17 ENCOUNTER — Telehealth: Admitting: Emergency Medicine

## 2024-05-17 DIAGNOSIS — U071 COVID-19: Secondary | ICD-10-CM

## 2024-05-17 MED ORDER — NIRMATRELVIR/RITONAVIR (PAXLOVID)TABLET: 3.0000 | ORAL_TABLET | Freq: Two times a day (BID) | ORAL | 0 refills | Status: AC

## 2024-05-17 NOTE — Patient Instructions (Signed)
 " Neil Gilmore, thank you for joining Jon CHRISTELLA Belt, NP for today's virtual visit.  While this provider is not your primary care provider (PCP), if your PCP is located in our provider database this encounter information will be shared with them immediately following your visit.   A [place] MyChart account gives you access to today's visit and all your visits, tests, and labs performed at [facility]  click here if you don't have a [place] [facility] MyChart account or go to [email or web address]  Consent: (Patient) Neil Gilmore provided verbal consent for this virtual visit at the beginning of the encounter.  Current Medications:  Current Outpatient Medications:    nirmatrelvir /ritonavir  (PAXLOVID ) 20 x 150 MG & 10 x 100MG  TABS, Take 3 tablets by mouth 2 (two) times daily for 5 days. (Take nirmatrelvir  150 mg two tablets twice daily for 5 days and ritonavir  100 mg one tablet twice daily for 5 days) Patient GFR is 96, Disp: 30 tablet, Rfl: 0   acetaminophen (TYLENOL) 325 MG tablet, Take 650 mg by mouth every 6 (six) hours as needed., Disp: , Rfl:    Adalimumab (HUMIRA) 40 MG/0.8ML PSKT, Inject into the skin., Disp: , Rfl:    ibuprofen (ADVIL) 200 MG tablet, Take 200 mg by mouth every 6 (six) hours as needed., Disp: , Rfl:    rosuvastatin  (CRESTOR ) 20 MG tablet, Take 1 tablet (20 mg total) by mouth daily., Disp: 90 tablet, Rfl: 1   Vonoprazan Fumarate  (VOQUEZNA ) 10 MG TABS, Take 1 tablet by mouth daily., Disp: 90 tablet, Rfl: 1   Vonoprazan Fumarate  (VOQUEZNA ) 10 MG TABS, Take 1 tablet by mouth daily., Disp: , Rfl:    Medications ordered in this encounter:  Meds ordered this encounter  Medications   nirmatrelvir /ritonavir  (PAXLOVID ) 20 x 150 MG & 10 x 100MG  TABS    Sig: Take 3 tablets by mouth 2 (two) times daily for 5 days. (Take nirmatrelvir  150 mg two tablets twice daily for 5 days and ritonavir  100 mg one tablet twice daily for 5 days) Patient GFR is 96    Dispense:  30  tablet    Refill:  0     *If you need refills on other medications prior to your next appointment, please contact your pharmacy*  Follow-Up: Call back or seek an in-person evaluation if the symptoms worsen or if the condition fails to improve as anticipated.  [facility] [phone]  Other Instructions  Continue using over the counter medicines to help manage your symptoms, such as tylenol for fever or saline nasal spray and mucinex for congestion.   If the copay for Paxlovid  is very expensive, do an internet search for pax access and enroll in the drug company's program to get help with high copays.   If you are having severe symptoms such as shortness of breath that is worsening, please seek emergency care in the ER or call 911.   Hold your rosuvastatin  and humira for now. You can resume rosuvastatin  2 days after finishing the paxlovid . Resume humira when your rheumatologist advises you to.   If you have been instructed to have an in-person evaluation today at a local Urgent Care facility, please use the link below. It will take you to a list of all of our available [facility], including address, phone number and hours of operation. Please do not delay care.  [facility]  If you or a family member do not have a primary care  provider, use the link below to schedule a visit and establish care. When you choose a [facility] primary care physician or advanced practice provider, you gain a long-term partner in health. Find a Primary Care Provider  Learn more about [facility]'s in-office and virtual care options: [facility] - Get Care Now  "

## 2024-05-17 NOTE — Progress Notes (Signed)
 " Virtual Visit Consent   Neil Gilmore, you are scheduled for a virtual visit with a [facility] provider today. Just as with appointments in the office, your consent must be obtained to participate. Your consent will be active for this visit and any virtual visit you may have with one of our providers in the next 365 days. If you have a MyChart account, a copy of this consent can be sent to you electronically.  As this is a virtual visit, video technology does not allow for your provider to perform a traditional examination. This may limit your provider's ability to fully assess your condition. If your provider identifies any concerns that need to be evaluated in person or the need to arrange testing (such as labs, EKG, etc.), we will make arrangements to do so. Although advances in technology are sophisticated, we cannot ensure that it will always work on either your end or our end. If the connection with a video visit is poor, the visit may have to be switched to a telephone visit. With either a video or telephone visit, we are not always able to ensure that we have a secure connection.  By engaging in this virtual visit, you consent to the provision of healthcare and authorize for your insurance to be billed (if applicable) for the services provided during this visit. Depending on your insurance coverage, you may receive a charge related to this service.  I need to obtain your verbal consent now. Are you willing to proceed with your visit today? Erhard Senske has provided verbal consent on 05/17/2024 for a virtual visit (video or telephone). Neil CHRISTELLA Belt, NP  Date: 05/17/2024 11:42 AM   Virtual Visit via Video Note   I, Neil Gilmore, connected with  Neil Gilmore  ([id number], 1975/10/17) on 05/17/2024 at 11:30 AM EST by a video-enabled telemedicine application and verified that I am speaking with the correct person using two identifiers.  Location: Patient: Virtual Visit Location Patient:  Home Provider: Virtual Visit Location Provider: Home Office   I discussed the limitations of evaluation and management by telemedicine and the availability of in person appointments. The patient expressed understanding and agreed to proceed.    History of Present Illness: Neil Gilmore is a 49 y.o. who identifies as a male who was assigned male at birth, and is being seen today for Covid. Covid positive last night. Sx started yesterday evening. Flu negative.   Sx are fever up to 103F with ear thermometer. Taking tylenol.   Also congested, headache. Coughing, is not sob.   Has labs tested by rheumatologist every 6 months - see normal results in Media tab in Epic 11/27/23. Normal liver and renal function. eGFR 96  He has stopped humira. Has not taken crestor  in a few days and he will continue to hold it. 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  Resting comfortably  at home.  Head is normocephalic,  atraumatic.  No labored breathing.  Speech is clear and coherent with logical content.  Patient is alert and oriented at baseline.    Assessment and Plan: 1. COVID (Primary) - nirmatrelvir /ritonavir  (PAXLOVID ) 20 x 150 MG & 10 x 100MG  TABS; Take 3 tablets by mouth 2 (two) times daily for 5 days. (Take nirmatrelvir  150 mg two tablets twice  daily for 5 days and ritonavir  100 mg one tablet twice daily for 5 days) Patient GFR is 96  Dispense: 30 tablet; Refill: 0  Continue tylenol and symptom management at home. Discussed pax access, dysgeusia   Declined offer of work note.   Follow Up Instructions: I discussed the assessment and treatment plan with the patient. The patient was provided an opportunity to ask questions and all were answered. The patient agreed with the plan and demonstrated an understanding of the instructions.  A copy of instructions were sent to the patient via MyChart unless otherwise noted below.    The patient was advised to call back or seek an in-person evaluation if the symptoms worsen or if the condition fails to improve as anticipated.    Neil CHRISTELLA Belt, NP    [1]  Allergies Allergen Reactions   Penicillins Hives, Itching, Other (See Comments), Shortness Of Breath and Swelling    Other reaction(s): Unknown Steven's Johnson Syndrome swell swell swell swell Steven's Johnson Syndrome    Tetracyclines & Related     Other reaction(s): Other (See Comments) Rash/swell Rash/swell Rash/swell   [2]  Current Outpatient Medications:    nirmatrelvir /ritonavir  (PAXLOVID ) 20 x 150 MG & 10 x 100MG  TABS, Take 3 tablets by mouth 2 (two) times daily for 5 days. (Take nirmatrelvir  150 mg two tablets twice daily for 5 days and ritonavir  100 mg one tablet twice daily for 5 days) Patient GFR is 96, Disp: 30 tablet, Rfl: 0   acetaminophen (TYLENOL) 325 MG tablet, Take 650 mg by mouth every 6 (six) hours as needed., Disp: , Rfl:    Adalimumab (HUMIRA) 40 MG/0.8ML PSKT, Inject into the skin., Disp: , Rfl:    ibuprofen (ADVIL) 200 MG tablet, Take 200 mg by mouth every 6 (six) hours as needed., Disp: , Rfl:    rosuvastatin  (CRESTOR ) 20 MG tablet, Take 1 tablet (20 mg total) by mouth daily., Disp: 90 tablet, Rfl: 1   Vonoprazan Fumarate  (VOQUEZNA ) 10 MG TABS, Take 1 tablet by mouth daily., Disp: 90 tablet, Rfl: 1    Vonoprazan Fumarate  (VOQUEZNA ) 10 MG TABS, Take 1 tablet by mouth daily., Disp: , Rfl:   "

## 2024-05-20 ENCOUNTER — Telehealth: Admitting: Family Medicine

## 2024-05-20 DIAGNOSIS — R197 Diarrhea, unspecified: Secondary | ICD-10-CM | POA: Diagnosis not present

## 2024-05-20 DIAGNOSIS — T375X5A Adverse effect of antiviral drugs, initial encounter: Secondary | ICD-10-CM

## 2024-05-20 NOTE — Progress Notes (Signed)
 " Virtual Visit Consent   Neil Gilmore, you are scheduled for a virtual visit with a [facility] provider today. Just as with appointments in the office, your consent must be obtained to participate. Your consent will be active for this visit and any virtual visit you may have with one of our providers in the next 365 days. If you have a MyChart account, a copy of this consent can be sent to you electronically.  As this is a virtual visit, video technology does not allow for your provider to perform a traditional examination. This may limit your provider's ability to fully assess your condition. If your provider identifies any concerns that need to be evaluated in person or the need to arrange testing (such as labs, EKG, etc.), we will make arrangements to do so. Although advances in technology are sophisticated, we cannot ensure that it will always work on either your end or our end. If the connection with a video visit is poor, the visit may have to be switched to a telephone visit. With either a video or telephone visit, we are not always able to ensure that we have a secure connection.  By engaging in this virtual visit, you consent to the provision of healthcare and authorize for your insurance to be billed (if applicable) for the services provided during this visit. Depending on your insurance coverage, you may receive a charge related to this service.  I need to obtain your verbal consent now. Are you willing to proceed with your visit today? Neil Gilmore has provided verbal consent on 05/20/2024 for a virtual visit (video or telephone). Neil Lamp, FNP  Date: 05/20/2024 6:27 PM   Virtual Visit via Video Note   I, Neil Gilmore, connected with  Neil Gilmore  ([id number], [birth year]) on 05/20/24 at  6:15 PM EST by a video-enabled telemedicine application and verified that I am speaking with the correct person using two identifiers.  Location: Patient: Virtual Visit Location Patient:  Home Provider: Virtual Visit Location Provider: Home Office   I discussed the limitations of evaluation and management by telemedicine and the availability of in person appointments. The patient expressed understanding and agreed to proceed.    History of Present Illness: Neil Gilmore is a 49 y.o. who identifies as a male who was assigned male at birth, and is being seen today for diarrhea since starting paxlovid  Monday for covid. It is worsening and his rectum is cracked and irritated. Stool is green and acidic. He is wanting to stop the paxlovid . 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  Resting comfortably  at home.  Head is normocephalic, atraumatic.  No labored breathing.  Speech is clear and coherent with logical content.  Patient is alert and oriented at baseline.    Assessment and Plan: 1. Diarrhea, unspecified type (Primary)  2. Adverse effect of antiviral drugs, initial encounter  Increase fluids, vaseline and hydrocortisone to rectum. Stop paxlovid . May use imodium cautiously   Follow  Up Instructions: I discussed the assessment and treatment plan with the patient. The patient was provided an opportunity to ask questions and all were answered. The patient agreed with the plan and demonstrated an understanding of the instructions.  A copy of instructions were sent to the  patient via MyChart unless otherwise noted below.     The patient was advised to call back or seek an in-person evaluation if the symptoms worsen or if the condition fails to improve as anticipated.    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 (Take nirmatrelvir  150 mg two tablets twice daily for 5 days and ritonavir  100 mg one tablet twice daily for 5 days) Patient GFR is 96, Disp: 30 tablet, Rfl: 0   rosuvastatin  (CRESTOR ) 20 MG tablet, Take 1 tablet (20 mg total) by mouth daily., Disp: 90 tablet, Rfl: 1   Vonoprazan Fumarate  (VOQUEZNA ) 10 MG TABS, Take 1 tablet by mouth daily., Disp: 90 tablet, Rfl: 1   Vonoprazan Fumarate  (VOQUEZNA ) 10 MG TABS, Take 1 tablet by mouth daily., Disp: , Rfl:   "

## 2024-05-20 NOTE — Patient Instructions (Signed)
 Diarrhea, Adult Diarrhea is frequent loose and sometimes watery bowel movements. Diarrhea can make you feel weak and cause you to become dehydrated. Dehydration is a condition in which there is not enough water or other fluids in the body. Dehydration can make you tired and thirsty, cause you to have a dry mouth, and decrease how often you urinate. Diarrhea typically lasts 2-3 days. However, it can last longer if it is a sign of something more serious. It is important to treat your diarrhea as told by your health care provider. Follow these instructions at home: Eating and drinking     Follow these recommendations as told by your health care provider: Take an oral rehydration solution (ORS). This is an over-the-counter medicine that helps return your body to its normal balance of nutrients and water. It is found at pharmacies and retail stores. Drink enough fluid to keep your urine pale yellow. Drink fluids such as water, diluted fruit juice, and low-calorie sports drinks. You can drink milk also, if desired. Sucking on ice chips is another way to get fluids. Avoid drinking fluids that contain a lot of sugar or caffeine, such as soda, energy drinks, and regular sports drinks. Avoid alcohol. Eat bland, easy-to-digest foods in small amounts as you are able. These foods include bananas, applesauce, rice, lean meats, toast, and crackers. Avoid spicy or fatty foods.  Medicines Take over-the-counter and prescription medicines only as told by your health care provider. If you were prescribed antibiotics, take them as told by your health care provider. Do not stop using the antibiotic even if you start to feel better. General instructions  Wash your hands often using soap and water for at least 20 seconds. If soap and water are not available, use hand sanitizer. Others in the household should wash their hands as well. Hands should be washed: After using the toilet or changing a diaper. Before  preparing, cooking, or serving food. While caring for a sick person or while visiting someone in a hospital. Rest at home while you recover. Take a warm bath to relieve any burning or pain from frequent diarrhea episodes. Watch your condition for any changes. Contact a health care provider if: You have a fever. Your diarrhea gets worse. You have new symptoms. You vomit every time you eat or drink. You feel light-headed, dizzy, or have a headache. You have muscle cramps. You have signs of dehydration, such as: Dark urine, very little urine, or no urine. Cracked lips. Dry mouth. Sunken eyes. Sleepiness. Weakness. You have bloody or black stools or stools that look like tar. You have severe pain, cramping, or bloating in your abdomen. Your skin feels cold and clammy. You feel confused. Get help right away if: You have chest pain or your heart is beating very quickly. You have trouble breathing or you are breathing very quickly. You feel extremely weak or you faint. These symptoms may be an emergency. Get help right away. Call 911. Do not wait to see if the symptoms will go away. Do not drive yourself to the hospital. This information is not intended to replace advice given to you by your health care provider. Make sure you discuss any questions you have with your health care provider. Document Revised: 10/09/2021 Document Reviewed: 10/09/2021 Elsevier Patient Education  2024 [employer].

## 2024-06-10 ENCOUNTER — Other Ambulatory Visit (HOSPITAL_BASED_OUTPATIENT_CLINIC_OR_DEPARTMENT_OTHER): Payer: Self-pay | Admitting: Family Medicine

## 2024-06-10 DIAGNOSIS — Z8249 Family history of ischemic heart disease and other diseases of the circulatory system: Secondary | ICD-10-CM

## 2024-06-22 ENCOUNTER — Other Ambulatory Visit (HOSPITAL_BASED_OUTPATIENT_CLINIC_OR_DEPARTMENT_OTHER)
